# Patient Record
Sex: Male | Born: 1985 | Race: Black or African American | Hispanic: No | Marital: Single | Smoking: Never smoker
Health system: Southern US, Community
[De-identification: ages and names within clinical notes are randomized; demographics above are authoritative.]

## PROBLEM LIST (undated history)

## (undated) DIAGNOSIS — W3400XA Accidental discharge from unspecified firearms or gun, initial encounter: Secondary | ICD-10-CM

## (undated) DIAGNOSIS — I2699 Other pulmonary embolism without acute cor pulmonale: Secondary | ICD-10-CM

## (undated) HISTORY — DX: Other pulmonary embolism without acute cor pulmonale: I26.99

---
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
## 2012-08-20 DIAGNOSIS — I2699 Other pulmonary embolism without acute cor pulmonale: Secondary | ICD-10-CM

## 2012-08-20 HISTORY — DX: Other pulmonary embolism without acute cor pulmonale: I26.99

## 2012-09-08 ENCOUNTER — Encounter (HOSPITAL_COMMUNITY): Payer: Self-pay | Admitting: *Deleted

## 2012-09-08 ENCOUNTER — Emergency Department (HOSPITAL_COMMUNITY): Payer: Self-pay

## 2012-09-08 ENCOUNTER — Inpatient Hospital Stay (HOSPITAL_COMMUNITY)
Admission: EM | Admit: 2012-09-08 | Discharge: 2012-09-10 | DRG: 176 | Disposition: A | Discharge status: Home / Self Care | Payer: MEDICAID | Attending: Internal Medicine | Admitting: Internal Medicine

## 2012-09-08 DIAGNOSIS — I4891 Unspecified atrial fibrillation: Secondary | ICD-10-CM | POA: Diagnosis present

## 2012-09-08 DIAGNOSIS — E663 Overweight: Secondary | ICD-10-CM

## 2012-09-08 DIAGNOSIS — Z6835 Body mass index (BMI) 35.0-35.9, adult: Secondary | ICD-10-CM

## 2012-09-08 DIAGNOSIS — I2699 Other pulmonary embolism without acute cor pulmonale: Principal | ICD-10-CM

## 2012-09-08 DIAGNOSIS — I82401 Acute embolism and thrombosis of unspecified deep veins of right lower extremity: Secondary | ICD-10-CM

## 2012-09-08 DIAGNOSIS — E669 Obesity, unspecified: Secondary | ICD-10-CM

## 2012-09-08 DIAGNOSIS — D696 Thrombocytopenia, unspecified: Secondary | ICD-10-CM

## 2012-09-08 DIAGNOSIS — Z87891 Personal history of nicotine dependence: Secondary | ICD-10-CM

## 2012-09-08 DIAGNOSIS — I82409 Acute embolism and thrombosis of unspecified deep veins of unspecified lower extremity: Secondary | ICD-10-CM | POA: Diagnosis present

## 2012-09-08 LAB — CBC WITH DIFFERENTIAL/PLATELET
Basophils Relative: 0 % (ref 0–1)
Eosinophils Relative: 2 % (ref 0–5)
HCT: 46.4 % (ref 39.0–52.0)
Hemoglobin: 16.5 g/dL (ref 13.0–17.0)
Lymphs Abs: 2.7 10*3/uL (ref 0.7–4.0)
MCH: 30.6 pg (ref 26.0–34.0)
MCV: 86.1 fL (ref 78.0–100.0)
Monocytes Absolute: 0.6 10*3/uL (ref 0.1–1.0)
Neutro Abs: 3.7 10*3/uL (ref 1.7–7.7)
Neutrophils Relative %: 52 % (ref 43–77)
RBC: 5.39 MIL/uL (ref 4.22–5.81)

## 2012-09-08 LAB — D-DIMER, QUANTITATIVE: D-Dimer, Quant: 3.05 ug/mL-FEU — ABNORMAL HIGH (ref 0.00–0.48)

## 2012-09-08 LAB — COMPREHENSIVE METABOLIC PANEL
ALT: 19 U/L (ref 0–53)
AST: 18 U/L (ref 0–37)
CO2: 22 mEq/L (ref 19–32)
Chloride: 102 mEq/L (ref 96–112)
Creatinine, Ser: 0.96 mg/dL (ref 0.50–1.35)
GFR calc non Af Amer: >90 mL/min (ref >90)
Total Bilirubin: 0.4 mg/dL (ref 0.3–1.2)

## 2012-09-08 MED ORDER — SORBITOL 70 % SOLN: 30.0000 mL | Freq: Every day | Status: DC | PRN

## 2012-09-08 MED ORDER — ASPIRIN 325 MG PO TABS
325.0000 mg | ORAL_TABLET | Freq: Once | ORAL | Status: AC
  Administered 2012-09-08: 325 mg via ORAL
  Filled 2012-09-08: qty 1

## 2012-09-08 MED ORDER — ENOXAPARIN SODIUM 120 MG/0.8ML ~~LOC~~ SOLN
1.0000 mg/kg | Freq: Once | SUBCUTANEOUS | Status: AC
  Administered 2012-09-08: 115 mg via SUBCUTANEOUS

## 2012-09-08 MED ORDER — ENOXAPARIN SODIUM 120 MG/0.8ML ~~LOC~~ SOLN
1.0000 mg/kg | Freq: Two times a day (BID) | SUBCUTANEOUS | Status: DC
  Administered 2012-09-09 – 2012-09-10 (×3): 115 mg via SUBCUTANEOUS
  Filled 2012-09-08 (×5): qty 0.8

## 2012-09-08 MED ORDER — POLYETHYLENE GLYCOL 3350 17 G PO PACK: 17.0000 g | PACK | Freq: Every day | ORAL | Status: DC | PRN

## 2012-09-08 MED ORDER — SODIUM CHLORIDE 0.9 % IJ SOLN
3.0000 mL | Freq: Two times a day (BID) | INTRAMUSCULAR | Status: DC
  Administered 2012-09-08 – 2012-09-10 (×3): 3 mL via INTRAVENOUS

## 2012-09-08 MED ORDER — ACETAMINOPHEN 325 MG PO TABS: 650.0000 mg | ORAL_TABLET | ORAL | Status: DC | PRN

## 2012-09-08 MED ORDER — ENOXAPARIN SODIUM 120 MG/0.8ML ~~LOC~~ SOLN
SUBCUTANEOUS | Status: AC
  Filled 2012-09-08: qty 0.8

## 2012-09-08 MED ORDER — POTASSIUM CHLORIDE IN NACL 20-0.9 MEQ/L-% IV SOLN
INTRAVENOUS | Status: DC
  Administered 2012-09-08 – 2012-09-09 (×2): via INTRAVENOUS

## 2012-09-08 MED ORDER — ONDANSETRON HCL 4 MG/2ML IJ SOLN: 4.0000 mg | INTRAMUSCULAR | Status: DC | PRN

## 2012-09-08 MED ORDER — TRAZODONE HCL 50 MG PO TABS: 50.0000 mg | ORAL_TABLET | Freq: Every evening | ORAL | Status: DC | PRN

## 2012-09-08 MED ORDER — IOHEXOL 350 MG/ML SOLN
120.0000 mL | Freq: Once | INTRAVENOUS | Status: AC | PRN
  Administered 2012-09-08: 120 mL via INTRAVENOUS

## 2012-09-08 NOTE — ED Notes (Addendum)
  PATIENTS COUSIN WANTS MD TO KNOW - SHE WAS ADMITTED TO [facility] LAST YEAR DUE TO AVM OF HER LUNG - BLED OUT INTO HER LUNG. THOUGHT THIS INFORMATION SHOULD BE SHARED.

## 2012-09-08 NOTE — ED Notes (Signed)
Report called to Helmut Muster, RN  - getting a bed for patients room.  Wait 15 minutes prior to transfer

## 2012-09-08 NOTE — ED Notes (Signed)
Chest pain for over 1 month, getting worse, sob.

## 2012-09-08 NOTE — ED Notes (Signed)
Patient states he stopped smoking about a month ago, just after this trouble with his breathing began.  States he noticed when he would climb stairs he became very winded.  His grandmother sent him here today because she thought he was "breathing" hard even while sitting.  He told her this had been going on for several weeks.  She encouraged him to get this checked out-- otherwise he would not have come to the ED today

## 2012-09-08 NOTE — ED Provider Notes (Signed)
History  This chart was scribed for Benny Lennert, MD by Bennett Scrape, ED Scribe. This patient was seen in room APA06/APA06 and the patient's care was started at 6:22 PM.  CSN: [id number]  Arrival date & time 09/08/12  1807   First MD Initiated Contact with Patient 09/08/12 1822      Chief Complaint  Patient presents with  . Chest Pain     Patient is a 27 y.o. male presenting with chest pain. The history is provided by the patient. No language interpreter was used.  Chest Pain Pain location:  Substernal area Pain quality: sharp   Pain radiates to:  Does not radiate Onset quality:  Gradual Duration:  1 month Timing:  Intermittent Progression:  Worsening Chronicity:  New Relieved by:  Nothing Worsened by:  Exertion Ineffective treatments:  None tried Associated symptoms: cough and shortness of breath   Associated symptoms: no abdominal pain, no back pain, no fatigue and no headache   Risk factors: no coronary artery disease, no diabetes mellitus and no hypertension     HPI Comments: Cristian Flores is a 27 y.o. male who presents to the Emergency Department complaining of one month of intermittent substernal CP episodes described as sharp that lasts 1 to 2 minutes with associated intermittent SOB. He reports that the SOB and CP occur while working and the SOB occurs with coughing as well. He denies having any symptoms currently. Pt denies having prior episodes of similar symptoms. He denies having a h/o heart or lung problems, HTN, HLD and DM. He denies having any prior stress test or cardiac catheterizations. Pt is a former smoker and current alcohol user. He denies cocaine use.  Pt works in [employer].  History reviewed. No pertinent past medical history.  History reviewed. No pertinent past surgical history.  History reviewed. No pertinent family history.  History  Substance Use Topics  . Smoking status: Former Smoker    Quit date: 08/09/2012  . Smokeless tobacco:  Not on file  . Alcohol Use: Yes      Review of Systems  Constitutional: Negative for appetite change and fatigue.  HENT: Negative for congestion, sinus pressure and ear discharge.   Eyes: Negative for discharge.  Respiratory: Positive for cough and shortness of breath.   Cardiovascular: Positive for chest pain.  Gastrointestinal: Negative for abdominal pain and diarrhea.  Genitourinary: Negative for frequency and hematuria.  Musculoskeletal: Negative for back pain.  Skin: Negative for rash.  Neurological: Negative for seizures and headaches.  Psychiatric/Behavioral: Negative for hallucinations.    Allergies  Review of patient's allergies indicates no known allergies.  Home Medications  No current outpatient prescriptions on file.  Triage Vitals: BP 122/77  Pulse 94  Temp(Src) 97.5 F (36.4 C) (Oral)  Ht 5\' 11"  (1.803 m)  Wt 255 lb (115.667 kg)  BMI 35.58 kg/m2  SpO2 97%  Physical Exam  Nursing note and vitals reviewed. Constitutional: He is oriented to person, place, and time. He appears well-developed and well-nourished.  HENT:  Head: Normocephalic and atraumatic.  Eyes: Conjunctivae and EOM are normal. No scleral icterus.  Neck: Neck supple. No thyromegaly present.  Cardiovascular: Normal rate and regular rhythm.  Exam reveals no gallop and no friction rub.   No murmur heard. Pulmonary/Chest: Effort normal and breath sounds normal. No stridor. He has no wheezes. He has no rales. He exhibits no tenderness.  Abdominal: Soft. He exhibits no distension. There is no tenderness. There is no rebound.  Musculoskeletal: Normal range  of motion. He exhibits no edema.  Lymphadenopathy:    He has no cervical adenopathy.  Neurological: He is alert and oriented to person, place, and time. Coordination normal.  Skin: Skin is warm and dry. No rash noted. No erythema.  Psychiatric: He has a normal mood and affect. His behavior is normal.    ED Course  Procedures (including  critical care time)   Medications  aspirin tablet 325 mg (325 mg Oral Given 09/08/12 1837)    DIAGNOSTIC STUDIES: Oxygen Saturation is 96% on room air, normal by my interpretation.    COORDINATION OF CARE: 6:24 PM-Discussed treatment plan which includes ASA, CXR, CBC panel, CMP and troponin with pt at bedside and pt agreed to plan. Will admit pt based on EKG changes.  8:46 PM-Pt rechecked and feels improved with medications listed above. Pt denies any recent leg swelling or long distance travels. Informed pt of lab and radiology findings. Discussed admission with pt an pt agreed.  Labs Reviewed  CBC WITH DIFFERENTIAL - Abnormal; Notable for the following:    Platelets 118 (*)    All other components within normal limits  COMPREHENSIVE METABOLIC PANEL - Abnormal; Notable for the following:    Glucose, Bld 102 (*)    All other components within normal limits  D-DIMER, QUANTITATIVE - Abnormal; Notable for the following:    D-Dimer, Quant 3.05 (*)    All other components within normal limits  TROPONIN I   Dg Chest Port 1 View  09/08/2012   *RADIOLOGY REPORT*  Clinical Data: Chest pain  PORTABLE CHEST - 1 VIEW  Comparison: None.  Findings: Normal mediastinum and cardiac silhouette.  Normal pulmonary  vasculature.  No evidence of effusion, infiltrate, or pneumothorax.  No acute bony abnormality.  IMPRESSION: No acute cardiopulmonary process.   Original Report Authenticated By: Genevive Bi, M.D.     No diagnosis found.  Date: 09/08/2012  Rate: 95  Rhythm: normal sinus rhythm  QRS Axis: normal  Intervals: normal  ST/T Wave abnormalities: inverted t waves  Conduction Disutrbances:none  Narrative Interpretation:   Old EKG Reviewed: none available     MDM     The chart was scribed for me under my direct supervision.  I personally performed the history, physical, and medical decision making and all procedures in the evaluation of this patient.Benny Lennert,  MD 09/08/12 2101

## 2012-09-08 NOTE — H&P (Signed)
[facility] History and Physical  Cristian Flores  XBJ:[id number]  DOB: Jan 08, 1986   DOA: 09/08/2012   PCP:   No PCP Per Patient   Chief Complaint:  Chest pain and shortness of breath for one month  HPI: Cristian Flores is an 27 y.o. male.   Moderately obese young [place] American gentleman with no significant past medical presents to the emergency room initially complaining of shortness of breath and central chest pain for one  month. In fact because of shortness of breath and difficulty climbing stairs he stopped smoking about a month ago. He is also been having a nonproductive cough, and generalized fatigue and weakness.  He denies any lower extremity edema or pain, he denies any long-distance travel; there is no family history of thrombophilia.  A CT angiogram was done in the emergency room which revealed bilateral pulmonary emboli with a moderate to large clot burden and evidence of right ventricular strain.  Rewiew of Systems:   All systems negative except as marked bold or noted in the HPI;  Constitutional: malaise, fever and chills. ;  Eyes: eye pain, redness and discharge. ;  ENMT: ear pain, hoarseness, nasal congestion, sinus pressure and sore throat. ;  Cardiovascular: chest pain, palpitations, diaphoresis, dyspnea and peripheral edema. ;  Respiratory: cough, hemoptysis, wheezing and stridor. ;  Gastrointestinal: nausea, vomiting, diarrhea, constipation, abdominal pain, melena, blood in stool, hematemesis, jaundice and rectal bleeding. unusual weight loss..  Genitourinary: frequency, dysuria, incontinence,flank pain and hematuria;  Musculoskeletal: back pain and neck pain. swelling and trauma.;  Skin: . pruritus, rash, abrasions, bruising and skin lesion.; ulcerations  Neuro: headache, lightheadedness and neck stiffness. weakness, altered level of consciousness , altered mental status, extremity weakness, burning feet, involuntary movement, seizure and syncope.  Psych: anxiety,  depression, insomnia, tearfulness, panic attacks, hallucinations, paranoia, suicidal or homicidal ideation    History reviewed. No pertinent past medical history.  History reviewed. No pertinent past surgical history.  Medications:  HOME MEDS: Prior to Admission medications   Not on File  No home meds   Allergies:  No Known Allergies  Social History:   reports that he quit smoking about 4 weeks ago. He does not have any smokeless tobacco history on file. He reports that  drinks alcohol. He reports that he does not use illicit drugs.  Family History:   His father is both diabetic and hypertensive; there is no other relevant family history   Physical Exam: Filed Vitals:   09/08/12 1816 09/08/12 1930  BP: 122/77 104/67  Pulse: 94 93  Temp: 97.5 F (36.4 C)   TempSrc: Oral   Resp:  25  Height: 5\' 11"  (1.803 m)   Weight: 115.667 kg (255 lb)   SpO2: 97% 97%   Blood pressure 104/67, pulse 93, temperature 97.5 F (36.4 C), temperature source Oral, resp. rate 25, height 5\' 11"  (1.803 m), weight 115.667 kg (255 lb), SpO2 97.00%.  GEN:  Pleasant obese young African American gentleman lying bed in no acute distress; cooperative with exam PSYCH:  alert and oriented x4; somewhat anxious; affect is appropriate. HEENT: Mucous membranes pink and anicteric; PERRLA; EOM intact; no cervical lymphadenopathy nor thyromegaly or carotid bruit; no JVD; Breasts:: Not examined CHEST WALL: No tenderness CHEST: Tachypnea, clear to auscultation bilaterally HEART: Regular rate; gallop rhythm; no murmur or rub BACK: No kyphosis no scoliosis; no CVA tenderness ABDOMEN: Obese, soft non-tender; no masses, no organomegaly, normal abdominal bowel sounds;  Rectal Exam: Not done EXTREMITIES: No bone or  joint deformity; no swelling; no edema; no tenderness; no ulcerations. Genitalia: not examined PULSES: 2+ and symmetric SKIN: Normal hydration no rash or ulceration CNS: Cranial nerves 2-12 grossly  intact no focal lateralizing neurologic deficit   Labs on Admission:  Basic Metabolic Panel:  Recent Labs Lab 09/08/12 1848  NA 138  K 3.8  CL 102  CO2 22  GLUCOSE 102*  BUN 14  CREATININE 0.96  CALCIUM 9.4   Liver Function Tests:  Recent Labs Lab 09/08/12 1848  AST 18  ALT 19  ALKPHOS 69  BILITOT 0.4  PROT 7.6  ALBUMIN 4.0   No results found for this basename: LIPASE, AMYLASE,  in the last 168 hours No results found for this basename: AMMONIA,  in the last 168 hours CBC:  Recent Labs Lab 09/08/12 1848  WBC 7.1  NEUTROABS 3.7  HGB 16.5  HCT 46.4  MCV 86.1  PLT 118*   Cardiac Enzymes:  Recent Labs Lab 09/08/12 1848  TROPONINI <0.30   BNP: No components found with this basename: POCBNP,  D-dimer: No components found with this basename: D-DIMER,  CBG: No results found for this basename: GLUCAP,  in the last 168 hours  Radiological Exams on Admission: Ct Angio Chest Pe W/cm &/or Wo Cm  09/08/2012   *RADIOLOGY REPORT*  Clinical Data: 41-month history of substernal chest pain, worse with exertion, cough, shortness of breath.  Former smoker.  CT ANGIOGRAPHY CHEST  Technique:  Multidetector CT imaging of the chest using the standard protocol during bolus administration of intravenous contrast. Multiplanar reconstructed images including MIPs were obtained and reviewed to evaluate the vascular anatomy.  Because of scanner malfunction during the initial imaging sequence, the images were repeated.  A diagnostic study was obtained.  Contrast:  120 ml Omnipaque 350 IV total.  Comparison: None.  Findings: Initial contrast opacification was very good.  The repeated images after the scanner malfunctioned have moderate contrast opacification of the pulmonary arteries.  However, the study is diagnostic.  Filling defects within the distal right and left main pulmonary arteries extending into branches of the left upper lobe pulmonary artery, branches of the left lower lobe  pulmonary artery, is of the right upper lobe pulmonary artery, the interlobar pulmonary artery, and branches of the right middle and lower lobe pulmonary arteries. Clot burden is moderate to large.  There is evidence of right heart strain, as the right ventricular diameter is greater than the left ventricular diameter.  Heart size upper normal to slightly enlarged.  No pericardial effusion.  No visible coronary atherosclerosis.  No visible atherosclerosis involving the thoracic or upper abdominal aorta.  Mild atelectasis in the lower lobes.  Lungs otherwise clear without localized airspace consolidation, interstitial disease, parenchymal nodules or masses, or evidence of pulmonary infarction.  No pleural effusions.  Central airways patent without significant bronchial wall thickening.  No significant mediastinal, hilar, or axillary lymphadenopathy. Thyroid gland mildly enlarged without nodularity.  Visualized extreme upper abdomen unremarkable.  Bone window images unremarkable.  IMPRESSION:  1.  Bilateral pulmonary emboli with moderate to large clot burden. Evidence of right heart strain. 2.  Mild atelectasis in the lower lobes.  No acute cardiopulmonary disease otherwise. 3.  Mild thyroid gland enlargement without nodularity.  These results were called by telephone on 09/08/2012 at 2043 hours to Dr. Estell Harpin of the emergency department, who verbally acknowledged these results.   Original Report Authenticated By: Hulan Saas, M.D.   Dg Chest Port 1 View  09/08/2012   *RADIOLOGY  REPORT*  Clinical Data: Chest pain  PORTABLE CHEST - 1 VIEW  Comparison: None.  Findings: Normal mediastinum and cardiac silhouette.  Normal pulmonary  vasculature.  No evidence of effusion, infiltrate, or pneumothorax.  No acute bony abnormality.  IMPRESSION: No acute cardiopulmonary process.   Original Report Authenticated By: Genevive Bi, M.D.    EKG: Independently reviewed. Poor R wave progression anterior leads; T waves V1 to  V4;   Assessment/Plan  Active Problems:      PE (pulmonary embolism)   Obesity, unspecified   Thrombocytopenia, unspecified   PLAN: Although the physical exam on the CT are suggestive of right ventricular strain, he has remarkable cardiovascular stability, and is saturating at 95% on room air. Will keep him on telemetry and start anti-coagulation with Lovenox. We'll defer initiation of oral to his primary care team will may choose warfarin or one of the newer anticoagulants. Of note this gentleman has no insurance.  Counseled him on the importance of weight loss and congratulated him on nicotine cessation.  His thrombocytopenia is somewhat concerning since we are starting Lovenox; it may be temporary due to high clot burden, but may need to switch to a non-heparin anticoagulant if platelet count falls further.  Other plans as per orders.  Code Status: Full code  Family Communication: Both appearance and other family present at the interview and examination  Disposition Plan: Likely discharged home in less than a week    Sampson Self Nocturnist [facility] Pager [phone]   09/08/2012, 8:59 PM

## 2012-09-09 ENCOUNTER — Inpatient Hospital Stay (HOSPITAL_COMMUNITY): Payer: MEDICAID

## 2012-09-09 DIAGNOSIS — E669 Obesity, unspecified: Secondary | ICD-10-CM | POA: Diagnosis present

## 2012-09-09 DIAGNOSIS — D696 Thrombocytopenia, unspecified: Secondary | ICD-10-CM | POA: Diagnosis present

## 2012-09-09 DIAGNOSIS — I369 Nonrheumatic tricuspid valve disorder, unspecified: Secondary | ICD-10-CM

## 2012-09-09 LAB — COMPREHENSIVE METABOLIC PANEL
BUN: 13 mg/dL (ref 6–23)
CO2: 22 mEq/L (ref 19–32)
Calcium: 8.9 mg/dL (ref 8.4–10.5)
Chloride: 106 mEq/L (ref 96–112)
Creatinine, Ser: 0.95 mg/dL (ref 0.50–1.35)
GFR calc Af Amer: >90 mL/min (ref >90)
GFR calc non Af Amer: >90 mL/min (ref >90)
Glucose, Bld: 115 mg/dL — ABNORMAL HIGH (ref 70–99)
Total Bilirubin: 0.3 mg/dL (ref 0.3–1.2)

## 2012-09-09 LAB — URINALYSIS, ROUTINE W REFLEX MICROSCOPIC
Glucose, UA: NEGATIVE mg/dL
Hgb urine dipstick: NEGATIVE
Ketones, ur: NEGATIVE mg/dL
Leukocytes, UA: NEGATIVE
pH: 6 (ref 5.0–8.0)

## 2012-09-09 LAB — CBC
HCT: 45.7 % (ref 39.0–52.0)
Hemoglobin: 16 g/dL (ref 13.0–17.0)
WBC: 6.2 10*3/uL (ref 4.0–10.5)

## 2012-09-09 LAB — ANTITHROMBIN III: AntiThromb III Func: 95 % (ref 75–120)

## 2012-09-09 NOTE — Progress Notes (Signed)
[facility] PROGRESS NOTE  Cristian Flores ZOX:[id number] DOB: 08/20/1985 DOA: 09/08/2012 PCP: No PCP Per Patient  Assessment/Plan: 1. Bilateral pulmonary emboli with moderate to large clot burden and associated RV dysfunction. Patient has been started on Lovenox for anticoagulation. He does not describe any inciting factors leading to his thromboembolism. Hypercoagulable panel has been sent and is pending. We'll check venous Dopplers of the lower extremities. Since the patient's lack of insurance, we are determining the best plan for outpatient therapy. Case management is arranging medications through assistance programs. I have discussed the case with hematology. Patient will followup in the hematology clinic for further management of his pulmonary emboli. Clinically, the patient appears to be doing quite well. Anticipate that he will be able to discharge home soon once appropriate outpatient therapy has been arranged. 2. Thrombocytopenia. Likely secondary to consumption from acute thrombosis.   Code Status: full code Family Communication: discussed with patient Disposition Plan: discharge home once stable   Consultants:  none  Procedures: Echo: - Left ventricle: The cavity size was normal. There was mild focal basal hypertrophy of the septum. Systolic function was normal. The estimated ejection fraction was in the range of 55% to 60%. Wall motion was normal; there were no regional wall motion abnormalities. Doppler parameters are consistent with abnormal left ventricular relaxation (grade 1 diastolic dysfunction). - Ventricular septum: The contour showed diastolic flattening and systolic flattening consistent with RV volume and pressure overload. - Right ventricle: The cavity size was moderately dilated. The moderator band was prominent. Systolic function was severely reduced. There is akinesisof the mid to distal RV free andlateral walls. McConnell's sign present (not entierely  classic since RV apical function is also somewhat impared) consistent with diagnosis of hemodynamically significant pulmonary embolus. - Right atrium: The atrium was moderately dilated. Central venous pressure: 15mm Hg (est). - Tricuspid valve: Mild regurgitation. - Pulmonary arteries: PA peak pressure: 67mm Hg (S). - Inferior vena cava: The vessel was dilated; the respirophasic diameter changes were blunted (< 50%); findings are consistent with elevated central venous pressure. - Pericardium, extracardiac: A trivial pericardial effusion was identified.    Antibiotics:  none  HPI/Subjective: No new complaints.  No chest pain.  Only mild shortness of breath on exertion.  Objective: Filed Vitals:   09/08/12 2232 09/09/12 0151 09/09/12 0649 09/09/12 1051  BP: 126/81 119/86 124/80 136/86  Pulse: 100 98 86 97  Temp: 98.1 F (36.7 C) 98.1 F (36.7 C) 99.5 F (37.5 C) 97.4 F (36.3 C)  TempSrc: Oral Oral Oral Oral  Resp: 20 20 20 20   Height: 5\' 11"  (1.803 m)     Weight: 114.9 kg (253 lb 4.9 oz)  116.892 kg (257 lb 11.2 oz)   SpO2: 95% 95% 95% 97%    Intake/Output Summary (Last 24 hours) at 09/09/12 1443 Last data filed at 09/09/12 1318  Gross per 24 hour  Intake    240 ml  Output    600 ml  Net   -360 ml   Filed Weights   09/08/12 1816 09/08/12 2232 09/09/12 0649  Weight: 115.667 kg (255 lb) 114.9 kg (253 lb 4.9 oz) 116.892 kg (257 lb 11.2 oz)    Exam:   General:  NAD  Cardiovascular: S1, S2 RRR  Respiratory: CTA B  Abdomen: Soft, nt, nd, bs+  Musculoskeletal: no pedal edema   Data Reviewed: Basic Metabolic Panel:  Recent Labs Lab 09/08/12 1848 09/09/12 0549  NA 138 140  K 3.8 4.0  CL 102 106  CO2 22 22  GLUCOSE 102* 115*  BUN 14 13  CREATININE 0.96 0.95  CALCIUM 9.4 8.9   Liver Function Tests:  Recent Labs Lab 09/08/12 1848 09/09/12 0549  AST 18 15  ALT 19 18  ALKPHOS 69 62  BILITOT 0.4 0.3  PROT 7.6 6.8  ALBUMIN 4.0 3.5   No  results found for this basename: LIPASE, AMYLASE,  in the last 168 hours No results found for this basename: AMMONIA,  in the last 168 hours CBC:  Recent Labs Lab 09/08/12 1848 09/09/12 0549  WBC 7.1 6.2  NEUTROABS 3.7  --   HGB 16.5 16.0  HCT 46.4 45.7  MCV 86.1 87.0  PLT 118* 133*   Cardiac Enzymes:  Recent Labs Lab 09/08/12 1848  TROPONINI <0.30   BNP (last 3 results) No results found for this basename: PROBNP,  in the last 8760 hours CBG: No results found for this basename: GLUCAP,  in the last 168 hours  No results found for this or any previous visit (from the past 240 hour(s)).   Studies: Ct Angio Chest Pe W/cm &/or Wo Cm  09/08/2012   *RADIOLOGY REPORT*  Clinical Data: 72-month history of substernal chest pain, worse with exertion, cough, shortness of breath.  Former smoker.  CT ANGIOGRAPHY CHEST  Technique:  Multidetector CT imaging of the chest using the standard protocol during bolus administration of intravenous contrast. Multiplanar reconstructed images including MIPs were obtained and reviewed to evaluate the vascular anatomy.  Because of scanner malfunction during the initial imaging sequence, the images were repeated.  A diagnostic study was obtained.  Contrast:  120 ml Omnipaque 350 IV total.  Comparison: None.  Findings: Initial contrast opacification was very good.  The repeated images after the scanner malfunctioned have moderate contrast opacification of the pulmonary arteries.  However, the study is diagnostic.  Filling defects within the distal right and left main pulmonary arteries extending into branches of the left upper lobe pulmonary artery, branches of the left lower lobe pulmonary artery, is of the right upper lobe pulmonary artery, the interlobar pulmonary artery, and branches of the right middle and lower lobe pulmonary arteries. Clot burden is moderate to large.  There is evidence of right heart strain, as the right ventricular diameter is greater than  the left ventricular diameter.  Heart size upper normal to slightly enlarged.  No pericardial effusion.  No visible coronary atherosclerosis.  No visible atherosclerosis involving the thoracic or upper abdominal aorta.  Mild atelectasis in the lower lobes.  Lungs otherwise clear without localized airspace consolidation, interstitial disease, parenchymal nodules or masses, or evidence of pulmonary infarction.  No pleural effusions.  Central airways patent without significant bronchial wall thickening.  No significant mediastinal, hilar, or axillary lymphadenopathy. Thyroid gland mildly enlarged without nodularity.  Visualized extreme upper abdomen unremarkable.  Bone window images unremarkable.  IMPRESSION:  1.  Bilateral pulmonary emboli with moderate to large clot burden. Evidence of right heart strain. 2.  Mild atelectasis in the lower lobes.  No acute cardiopulmonary disease otherwise. 3.  Mild thyroid gland enlargement without nodularity.  These results were called by telephone on 09/08/2012 at 2043 hours to Dr. Estell Harpin of the emergency department, who verbally acknowledged these results.   Original Report Authenticated By: Hulan Saas, M.D.   Dg Chest Port 1 View  09/08/2012   *RADIOLOGY REPORT*  Clinical Data: Chest pain  PORTABLE CHEST - 1 VIEW  Comparison: None.  Findings: Normal mediastinum and cardiac silhouette.  Normal pulmonary  vasculature.  No evidence of effusion, infiltrate, or pneumothorax.  No acute bony abnormality.  IMPRESSION: No acute cardiopulmonary process.   Original Report Authenticated By: Genevive Bi, M.D.    Scheduled Meds: . enoxaparin (LOVENOX) injection  1 mg/kg Subcutaneous Q12H  . sodium chloride  3 mL Intravenous Q12H   Continuous Infusions: . 0.9 % NaCl with KCl 20 mEq / L 100 mL/hr at 09/09/12 1610    Active Problems:   Overweight   PE (pulmonary embolism)   Obesity, unspecified   Thrombocytopenia, unspecified    Time spent:     MEMON,JEHANZEB  [facility] Pager [phone]. If 7PM-7AM, please contact night-coverage at [email or web address], password [facility] 09/09/2012, 2:43 PM  LOS: 1 day

## 2012-09-09 NOTE — Care Management Note (Signed)
    Page 1 of 1   09/10/2012     3:35:22 PM   CARE MANAGEMENT NOTE 09/10/2012  Patient:  Cristian Flores, Cristian Flores   Account Number:  [id number]  Date Initiated:  09/09/2012  Documentation initiated by:  Sharrie Rothman  Subjective/Objective Assessment:   Pt admitted from home with bilateral PE's. Pt lives with his brother and will return home at discharge. Pt does work but has been laid off for now. Pt has no insurance and no PCP.     Action/Plan:   CM will fax application form to [employer] for assistance with Xarelto. If pt does not qualify, will give voucher for medication assistance. CM will also arrange PCP prior to discharge. Financial counselor is aware of self pay status.   Anticipated DC Date:  09/14/2012   Anticipated DC Plan:  HOME/SELF CARE  In-house referral  Financial Counselor      DC Planning Services  CM consult      Choice offered to / List presented to:             Status of service:  Completed, signed off Medicare Important Message given?   (If response is "NO", the following Medicare IM given date fields will be blank) Date Medicare IM given:   Date Additional Medicare IM given:    Discharge Disposition:  HOME/SELF CARE  Per UR Regulation:    If discussed at Long Length of Stay Meetings, dates discussed:    Comments:  09/10/12 1530 Arlyss Queen, RN BSN CM Pt discharging home today. Laural Benes and [employer] Patient Assistance Program has approved pt for help with Xarelto. Pt has all numbers and information to get prescriptions filled at pharmacy. Pt has also been scheduled for PCP followup and Hematology followup and documented on AVS. No other needs noted.  09/09/12 1200 Arlyss Queen, RN BSN CM

## 2012-09-09 NOTE — Progress Notes (Signed)
*  PRELIMINARY RESULTS* Echocardiogram 2D Echo without contrast has been performed.  Conrad Buchanan [place] 09/09/2012, 9:55 AM

## 2012-09-09 NOTE — Plan of Care (Signed)
Problem: Phase I Progression Outcomes Goal: Pain controlled with appropriate interventions Outcome: Completed/Met Date Met:  09/09/12 Denies pain Goal: OOB as tolerated unless otherwise ordered Outcome: Completed/Met Date Met:  09/09/12 Up ad lib

## 2012-09-09 NOTE — Progress Notes (Signed)
UR chart review completed.  

## 2012-09-09 NOTE — Clinical Social Work Note (Signed)
CSW received referral for no insurance and need for chronic anticoagulation. CM notified. CSW signing off but can be reconsulted if needed.  Derenda Fennel, [place] [phone]

## 2012-09-10 DIAGNOSIS — I82409 Acute embolism and thrombosis of unspecified deep veins of unspecified lower extremity: Secondary | ICD-10-CM

## 2012-09-10 DIAGNOSIS — I82401 Acute embolism and thrombosis of unspecified deep veins of right lower extremity: Secondary | ICD-10-CM | POA: Diagnosis present

## 2012-09-10 DIAGNOSIS — E663 Overweight: Secondary | ICD-10-CM

## 2012-09-10 LAB — LUPUS ANTICOAGULANT PANEL: Lupus Anticoagulant: NOT DETECTED

## 2012-09-10 LAB — CBC
Hemoglobin: 15.6 g/dL (ref 13.0–17.0)
Platelets: 137 10*3/uL — ABNORMAL LOW (ref 150–400)
RBC: 5.12 MIL/uL (ref 4.22–5.81)

## 2012-09-10 MED ORDER — RIVAROXABAN 15 MG PO TABS: 15.0000 mg | ORAL_TABLET | Freq: Two times a day (BID) | ORAL | Status: DC

## 2012-09-10 MED ORDER — RIVAROXABAN 20 MG PO TABS: 20.0000 mg | ORAL_TABLET | Freq: Every day | ORAL | Status: DC

## 2012-09-10 NOTE — Discharge Summary (Signed)
Physician Discharge Summary  Cristian Flores:[id number] DOB: March 08, 1986 DOA: 09/08/2012  PCP: No PCP Per Patient  Admit date: 09/08/2012 Discharge date: 09/10/2012  Time spent: 40 minutes  Recommendations for Outpatient Follow-up:  1. Patient was set up with primary care physician. 2. He'll followup with the hematology clinic for further management of his pulmonary embolism and followup of his hypercoagulable panel.  Discharge Diagnoses:  Active Problems:   Overweight   PE (pulmonary embolism)   Obesity, unspecified   Thrombocytopenia, unspecified   Right leg DVT   Discharge Condition: Improved  Diet recommendation: Regular diet  Filed Weights   09/08/12 2232 09/09/12 0649 09/10/12 0500  Weight: 114.9 kg (253 lb 4.9 oz) 116.892 kg (257 lb 11.2 oz) 116.9 kg (257 lb 11.5 oz)    History of present illness:  Cristian Flores is an 27 y.o. male. Moderately obese young [place] American gentleman with no significant past medical presents to the emergency room initially complaining of shortness of breath and central chest pain for one month. In fact because of shortness of breath and difficulty climbing stairs he stopped smoking about a month ago. He is also been having a nonproductive cough, and generalized fatigue and weakness.  He denies any lower extremity edema or pain, he denies any long-distance travel; there is no family history of thrombophilia.  A CT angiogram was done in the emergency room which revealed bilateral pulmonary emboli with a moderate to large clot burden and evidence of right ventricular strain.   Hospital Course:  This patient was admitted to the hospital with chest pain and shortness of breath. He was found to have bilateral pulmonary emboli with moderate to large clot burden and associated right ventricular strain. He had an episode of transient atrial fibrillation in the emergency room. He was admitted to the medical floor on telemetry and was started on therapeutic  dose of Lovenox. Echocardiogram confirmed right ventricular dysfunction. Hypercoagulable panel was sent and is pending. He has been transitioned to Xarelto therapy and will need to stay on anticoagulation for at least 6 months. I discussed the case with the hematology clinic who will follow the patient as an outpatient. The patient does not have shortness of breath chest pain. He is ambulating without any difficulty and is requesting discharge home. The patient did have right lower extremity venous Dopplers performed which did indicate remnants of a DVT. This was likely the source of the pulmonary emboli. He will need to continue anticoagulation.  Procedures: Echo:- Left ventricle: The cavity size was normal. There was mild focal basal hypertrophy of the septum. Systolic function was normal. The estimated ejection fraction was in the range of 55% to 60%. Wall motion was normal; there were no regional wall motion abnormalities. Doppler parameters are consistent with abnormal left ventricular relaxation (grade 1 diastolic dysfunction). - Ventricular septum: The contour showed diastolic flattening and systolic flattening consistent with RV volume and pressure overload. - Right ventricle: The cavity size was moderately dilated. The moderator band was prominent. Systolic function was severely reduced. There is akinesisof the mid to distal RV free andlateral walls. McConnell's sign present (not entierely classic since RV apical function is also somewhat impared) consistent with diagnosis of hemodynamically significant pulmonary embolus. - Right atrium: The atrium was moderately dilated. Central venous pressure: 15mm Hg (est). - Tricuspid valve: Mild regurgitation. - Pulmonary arteries: PA peak pressure: 67mm Hg (S). - Inferior vena cava: The vessel was dilated; the respirophasic diameter changes were blunted (< 50%); findings  are consistent with elevated central venous pressure. - Pericardium,  extracardiac: A trivial pericardial effusion was identified.    Consultations:  None  Discharge Exam: Filed Vitals:   09/09/12 1850 09/09/12 2152 09/10/12 0500 09/10/12 1424  BP: 102/66 130/85 107/73 115/80  Pulse: 92 95 88 88  Temp: 97.9 F (36.6 C) 97.9 F (36.6 C) 97.5 F (36.4 C) 97.9 F (36.6 C)  TempSrc: Oral Oral Oral Oral  Resp: 20 20 17 18   Height:      Weight:   116.9 kg (257 lb 11.5 oz)   SpO2: 93% 94% 94% 98%    General: No acute distress Cardiovascular: S1, S2, regular rate and rhythm Respiratory: Clear to auscultation bilaterally  Discharge Instructions  Discharge Orders   Future Appointments Provider Department Dept Phone   10/01/2012 3:00 PM Claudia Desanctis [facility] [phone]   Future Orders Complete By Expires     Call MD for:  difficulty breathing, headache or visual disturbances  As directed     Call MD for:  extreme fatigue  As directed     Call MD for:  redness, tenderness, or signs of infection (pain, swelling, redness, odor or green/yellow discharge around incision site)  As directed     Diet - low sodium heart healthy  As directed     Increase activity slowly  As directed         Medication List    TAKE these medications       Rivaroxaban 15 MG Tabs tablet  Commonly known as:  XARELTO  Take 1 tablet (15 mg total) by mouth 2 (two) times daily. For 21 days     Rivaroxaban 20 MG Tabs  Commonly known as:  XARELTO  Take 1 tablet (20 mg total) by mouth daily. Start taking this medication once Xarelto 15mg  tabs are complete       No Known Allergies     Follow-up Information   Follow up On 09/24/2012. (at 10:15)    Contact information:   [facility] [phone]       Follow up with [facility] On 10/01/2012. (at 3:00)    Contact information:   [address] [place] [address]        The results of significant diagnostics from this hospitalization (including  imaging, microbiology, ancillary and laboratory) are listed below for reference.    Significant Diagnostic Studies: Ct Angio Chest Pe W/cm &/or Wo Cm  09/08/2012   *RADIOLOGY REPORT*  Clinical Data: 43-month history of substernal chest pain, worse with exertion, cough, shortness of breath.  Former smoker.  CT ANGIOGRAPHY CHEST  Technique:  Multidetector CT imaging of the chest using the standard protocol during bolus administration of intravenous contrast. Multiplanar reconstructed images including MIPs were obtained and reviewed to evaluate the vascular anatomy.  Because of scanner malfunction during the initial imaging sequence, the images were repeated.  A diagnostic study was obtained.  Contrast:  120 ml Omnipaque 350 IV total.  Comparison: None.  Findings: Initial contrast opacification was very good.  The repeated images after the scanner malfunctioned have moderate contrast opacification of the pulmonary arteries.  However, the study is diagnostic.  Filling defects within the distal right and left main pulmonary arteries extending into branches of the left upper lobe pulmonary artery, branches of the left lower lobe pulmonary artery, is of the right upper lobe pulmonary artery, the interlobar pulmonary artery, and branches of the right middle and  lower lobe pulmonary arteries. Clot burden is moderate to large.  There is evidence of right heart strain, as the right ventricular diameter is greater than the left ventricular diameter.  Heart size upper normal to slightly enlarged.  No pericardial effusion.  No visible coronary atherosclerosis.  No visible atherosclerosis involving the thoracic or upper abdominal aorta.  Mild atelectasis in the lower lobes.  Lungs otherwise clear without localized airspace consolidation, interstitial disease, parenchymal nodules or masses, or evidence of pulmonary infarction.  No pleural effusions.  Central airways patent without significant bronchial wall thickening.  No  significant mediastinal, hilar, or axillary lymphadenopathy. Thyroid gland mildly enlarged without nodularity.  Visualized extreme upper abdomen unremarkable.  Bone window images unremarkable.  IMPRESSION:  1.  Bilateral pulmonary emboli with moderate to large clot burden. Evidence of right heart strain. 2.  Mild atelectasis in the lower lobes.  No acute cardiopulmonary disease otherwise. 3.  Mild thyroid gland enlargement without nodularity.  These results were called by telephone on 09/08/2012 at 2043 hours to Dr. Estell Harpin of the emergency department, who verbally acknowledged these results.   Original Report Authenticated By: Hulan Saas, M.D.   US Venous Img Lower Bilateral  09/09/2012   *RADIOLOGY REPORT*  Clinical Data: History of prior DVT and pulmonary embolism  BILATERAL LOWER EXTREMITY VENOUS DUPLEX ULTRASOUND  Technique:  Gray-scale sonography with graded compression, as well as color Doppler and duplex ultrasound, were performed to evaluate the deep venous system of both lower extremities from the level of the common femoral vein through the popliteal and proximal calf veins.  Spectral Doppler was utilized to evaluate flow at rest and with distal augmentation maneuvers.  Comparison:  Chest CT - 09/08/2012  Findings:  There is a minimal amount of nonocclusive slightly hyperechoic thrombus within the right popliteal vein (images 20 through 23).  Otherwise, normal compressibility of bilateral common femoral, superficial femoral, and left popliteal veins is demonstrated, as well as the visualized proximal calf veins.  No filling defects to suggest DVT on grayscale or color Doppler imaging.  IMPRESSION:  1.  Very minimal amount of nonocclusive thrombus within the right popliteal vein.  2. Otherwise, no evidence of DVT within either lower extremity.   Original Report Authenticated By: Tacey Ruiz, MD   Dg Chest Port 1 View  09/08/2012   *RADIOLOGY REPORT*  Clinical Data: Chest pain  PORTABLE CHEST - 1  VIEW  Comparison: None.  Findings: Normal mediastinum and cardiac silhouette.  Normal pulmonary  vasculature.  No evidence of effusion, infiltrate, or pneumothorax.  No acute bony abnormality.  IMPRESSION: No acute cardiopulmonary process.   Original Report Authenticated By: Genevive Bi, M.D.    Microbiology: No results found for this or any previous visit (from the past 240 hour(s)).   Labs: Basic Metabolic Panel:  Recent Labs Lab 09/08/12 1848 09/09/12 0549  NA 138 140  K 3.8 4.0  CL 102 106  CO2 22 22  GLUCOSE 102* 115*  BUN 14 13  CREATININE 0.96 0.95  CALCIUM 9.4 8.9   Liver Function Tests:  Recent Labs Lab 09/08/12 1848 09/09/12 0549  AST 18 15  ALT 19 18  ALKPHOS 69 62  BILITOT 0.4 0.3  PROT 7.6 6.8  ALBUMIN 4.0 3.5   No results found for this basename: LIPASE, AMYLASE,  in the last 168 hours No results found for this basename: AMMONIA,  in the last 168 hours CBC:  Recent Labs Lab 09/08/12 1848 09/09/12 0549 09/10/12 0527  WBC 7.1  6.2 6.5  NEUTROABS 3.7  --   --   HGB 16.5 16.0 15.6  HCT 46.4 45.7 44.3  MCV 86.1 87.0 86.5  PLT 118* 133* 137*   Cardiac Enzymes:  Recent Labs Lab 09/08/12 1848  TROPONINI <0.30   BNP: BNP (last 3 results) No results found for this basename: PROBNP,  in the last 8760 hours CBG: No results found for this basename: GLUCAP,  in the last 168 hours     Signed:  Oather Muilenburg  [facility] 09/10/2012, 3:37 PM

## 2012-09-11 LAB — FACTOR 5 LEIDEN

## 2012-09-11 LAB — PROTEIN S, TOTAL: Protein S Ag, Total: 128 % (ref 60–150)

## 2012-09-11 LAB — PROTEIN C, TOTAL: Protein C, Total: 85 % (ref 72–160)

## 2012-09-11 LAB — BETA-2-GLYCOPROTEIN I ABS, IGG/M/A
Beta-2 Glyco I IgG: 0 G Units (ref <20)
Beta-2-Glycoprotein I IgA: 0 A Units (ref <20)

## 2012-10-01 ENCOUNTER — Encounter (HOSPITAL_COMMUNITY): Payer: Self-pay

## 2012-10-01 ENCOUNTER — Encounter (HOSPITAL_COMMUNITY): Payer: Self-pay | Attending: Hematology and Oncology

## 2012-10-01 ENCOUNTER — Ambulatory Visit (HOSPITAL_COMMUNITY): Payer: Self-pay | Admitting: Oncology

## 2012-10-01 VITALS — BP 121/78 | HR 83 | Temp 97.2°F | Resp 18 | Wt 259.0 lb

## 2012-10-01 DIAGNOSIS — Z7901 Long term (current) use of anticoagulants: Secondary | ICD-10-CM | POA: Insufficient documentation

## 2012-10-01 DIAGNOSIS — I519 Heart disease, unspecified: Secondary | ICD-10-CM

## 2012-10-01 DIAGNOSIS — I2699 Other pulmonary embolism without acute cor pulmonale: Secondary | ICD-10-CM | POA: Insufficient documentation

## 2012-10-01 DIAGNOSIS — F17200 Nicotine dependence, unspecified, uncomplicated: Secondary | ICD-10-CM

## 2012-10-01 NOTE — Progress Notes (Signed)
Patient History and Physical   Cristian Flores [id number] 08-19-1985 27 y.o. 10/01/2012    Chief Complaint: Pulmonary embolism.   HPI:  Cristian Flores is a 27 year old  Man was hospitalized in May of 2014 4 fairly large bilateral pulmonary embolism involving the main pulmonary arteries.  This was spontaneous and patient prior to  this was reportedly very active even though he is a smoker.there was evidence of right heart strain impression was treated with anticoagulation initially with Lovenox and was transitioned to Xarelto which is still currently on.he tells me that prior to hospitalization he was having shortness of breath for about 4 weeks.  He states that he feels better with decreased shortness of breath at present. Doppler of lower extremities, showed minimal amount of nonocclusive thrombus within the right popliteal vein. Both CTA and doppler results are detailed below.  While in the hospital he had extensive hypercoagulability workup, including protein C and protein S, antithrombin III, factor V Leiden,antiphospholipid antibodies which were all negative.I was not able to find prothrombin gene mutation.  Patient denies any  prior personal history of DVT or PE or family history of hypercoagulability disorder.   PMH: Past Medical History  Diagnosis Date  . Pulmonary embolism May 2014    History reviewed. No pertinent past surgical history.  Allergies: No Known Allergies  Medications: Current outpatient prescriptions:Rivaroxaban (XARELTO) 15 MG TABS tablet, Take 1 tablet (15 mg total) by mouth 2 (two) times daily. For 21 days, Disp: 42 tablet, Rfl: 0;  Rivaroxaban (XARELTO) 20 MG TABS, Take 1 tablet (20 mg total) by mouth daily. Start taking this medication once Xarelto 15mg  tabs are complete, Disp: 30 tablet, Rfl: 6   Social History:   reports that he has been smoking Cigarettes.  He has been smoking about 0.00 packs per day. He does not have any smokeless tobacco history on file. He  reports that he drinks about 3.5 ounces of alcohol per week. He reports that he does not use illicit drugs. He states that he smokes a black  And Myer cigarettes about 5 sticks a day and reportedly has been smoking for 2 years.  He drinks about 4 drinks a day.  Family History: Family History  Problem Relation Age of Onset  . Diabetes Father   . Hypertension Father   He denies any family history of hypercoagulability disorder.has 2 boys and a girl all of whom do not have any history of hypercoagulability disorder.  He is single works in [employer] and reportedly live an active lifestyle including clean basketball prior to developing DVT/PE recently.  Review of Systems: 14 point review of system is as in the history above otherwise negative.   Physical Exam: Blood pressure 121/78, pulse 83, temperature 97.2 F (36.2 C), temperature source Oral, resp. rate 18, weight 259 lb (117.482 kg). GENERAL: No distress, obese.  SKIN:  No rashes or significant lesions an ecchymosis  HEAD: Normocephalic, No masses, lesions, tenderness or abnormalities  EYES: Conjunctiva are pink and non-injected  ENT: External ears normal ,lips, buccal mucosa, and tongue normal and mucous membranes are moist  LYMPH: No palpable lymphadenopathy, in the neck, supraclavicular or axilla. BREAST:Normal without mass, skin or nipple changes or axillary nodes,  LUNGS: clear to auscultation , no crackles or wheezes HEART: regular rate & rhythm, no murmurs, no gallops, S1 normal and S2 normal  ABDOMEN: Abdomen soft, non-tender, normal bowel sounds, no masses or organomegaly and no hepatosplenomegaly palpable. EXTREMITIES: No edema, no skin discoloration or tenderness NEURO:  alert & oriented , no focal motor/sensory deficits.     Lab Results: Lab Results  Component Value Date   WBC 6.5 09/10/2012   HGB 15.6 09/10/2012   HCT 44.3 09/10/2012   MCV 86.5 09/10/2012   PLT 137* 09/10/2012     Chemistry      Component Value  Date/Time   NA 140 09/09/2012 0549   K 4.0 09/09/2012 0549   CL 106 09/09/2012 0549   CO2 22 09/09/2012 0549   BUN 13 09/09/2012 0549   CREATININE 0.95 09/09/2012 0549      Component Value Date/Time   CALCIUM 8.9 09/09/2012 0549   ALKPHOS 62 09/09/2012 0549   AST 15 09/09/2012 0549   ALT 18 09/09/2012 0549   BILITOT 0.3 09/09/2012 0549         Radiological Studies: Ct Angio Chest Pe W/cm &/or Wo Cm  09/08/2012   *RADIOLOGY REPORT*  Clinical Data: 5-month history of substernal chest pain, worse with exertion, cough, shortness of breath.  Former smoker.  CT ANGIOGRAPHY CHEST  Technique:  Multidetector CT imaging of the chest using the standard protocol during bolus administration of intravenous contrast. Multiplanar reconstructed images including MIPs were obtained and reviewed to evaluate the vascular anatomy.  Because of scanner malfunction during the initial imaging sequence, the images were repeated.  A diagnostic study was obtained.  Contrast:  120 ml Omnipaque 350 IV total.  Comparison: None.  Findings: Initial contrast opacification was very good.  The repeated images after the scanner malfunctioned have moderate contrast opacification of the pulmonary arteries.  However, the study is diagnostic.  Filling defects within the distal right and left main pulmonary arteries extending into branches of the left upper lobe pulmonary artery, branches of the left lower lobe pulmonary artery, is of the right upper lobe pulmonary artery, the interlobar pulmonary artery, and branches of the right middle and lower lobe pulmonary arteries. Clot burden is moderate to large.  There is evidence of right heart strain, as the right ventricular diameter is greater than the left ventricular diameter.  Heart size upper normal to slightly enlarged.  No pericardial effusion.  No visible coronary atherosclerosis.  No visible atherosclerosis involving the thoracic or upper abdominal aorta.  Mild atelectasis in the lower lobes.   Lungs otherwise clear without localized airspace consolidation, interstitial disease, parenchymal nodules or masses, or evidence of pulmonary infarction.  No pleural effusions.  Central airways patent without significant bronchial wall thickening.  No significant mediastinal, hilar, or axillary lymphadenopathy. Thyroid gland mildly enlarged without nodularity.  Visualized extreme upper abdomen unremarkable.  Bone window images unremarkable.    IMPRESSION:  1.  Bilateral pulmonary emboli with moderate to large clot burden. Evidence of right heart strain. 2.  Mild atelectasis in the lower lobes.  No acute cardiopulmonary disease otherwise. 3.  Mild thyroid gland enlargement without nodularity.  These results were called by telephone on 09/08/2012 at 2043 hours to Dr. Estell Harpin of the emergency department, who verbally acknowledged these results.   Original Report Authenticated By: Hulan Saas, M.D.   US Venous Img Lower Bilateral  09/09/2012   *RADIOLOGY REPORT*  Clinical Data: History of prior DVT and pulmonary embolism  BILATERAL LOWER EXTREMITY VENOUS DUPLEX ULTRASOUND  Technique:  Gray-scale sonography with graded compression, as well as color Doppler and duplex ultrasound, were performed to evaluate the deep venous system of both lower extremities from the level of the common femoral vein through the popliteal and proximal calf veins.  Spectral Doppler was  utilized to evaluate flow at rest and with distal augmentation maneuvers.  Comparison:  Chest CT - 09/08/2012  Findings:  There is a minimal amount of nonocclusive slightly hyperechoic thrombus within the right popliteal vein (images 20 through 23).  Otherwise, normal compressibility of bilateral common femoral, superficial femoral, and left popliteal veins is demonstrated, as well as the visualized proximal calf veins.  No filling defects to suggest DVT on grayscale or color Doppler imaging.  IMPRESSION:  1.  Very minimal amount of nonocclusive thrombus  within the right popliteal vein.  2. Otherwise, no evidence of DVT within either lower extremity.   Original Report Authenticated By: Tacey Ruiz, MD   Dg Chest Port 1 View  09/08/2012   *RADIOLOGY REPORT*  Clinical Data: Chest pain  PORTABLE CHEST - 1 VIEW  Comparison: None.  Findings: Normal mediastinum and cardiac silhouette.  Normal pulmonary  vasculature.  No evidence of effusion, infiltrate, or pneumothorax.  No acute bony abnormality.  IMPRESSION: No acute cardiopulmonary process.   Original Report Authenticated By: Genevive Bi, M.D.      Impression: Mr. Mengel recently had Bilateral pulmonary emboli with moderate to large clot burden with evidence of right heart strain and evidence of mild RLE DVT. Filling defects was found within the distal right and left main pulmonary arteries extending into branches of the left upper lobe pulmonary artery, branches of the left lower lobe pulmonary artery among others.  He  Clot burden was  by no means trivial. This happened spontaneously in a young man who lives in active lifestyle.  Obviously smoking and obesity are risk factors for thromboembolism.  Hypercoagulability workup has been negative so far in the absence of prothrombin gene mutation.  Given the seriousness of this first episode,patient is a candidate for long-term anticoagulation by opinion .    Recommendations: 1. I will order prothrombin gene mutation. 2. Given  relatively no identifiable hypercoagulability disorder in the absence of prothrombin gene mutation test, to explain  thrombosis I think that a CT of the abdomen/Pelvis to rule out possible malignancy even though this is unlikely is not unreasonable. 3. Patient will return to clinic in 3 months  4.I had a very long discussion with him, I recommend that he is an the severity of the initial episode, the patient been on very long-term anticoagulation more than 6 months.  Exact duration is unclear in absence of supportive data on this.   However I will leave this to the discretion of the attending physician.  I counseled him on smoking cessation and weight loss as this may reduce his risk further.  I also educated the patient on down side of Xarelto and to avoid high-risk behavior that may put him at increased risk of trauma and bleeding including but not limited to contact sports and motorcycle riding.  I made him aware that Xarelto has no specific antidote and that intracranial bleeding while on Xarelto is potentially fatal.  He verbalized understanding. Based on some data from [facility] trial d-dimer can sometimes can be used determining risk  Of recurrent thrombosis is after stopping anticoagulant. I his case the degree of initial episode makes a strong case for prolonged duration of anticoagulant more than the usual 3-6 months.     All questions were satisfactorily answered.  I spent 35 minutes counseling the patient face to face. The total time spent in the appointment was 60 minutes.   Sherral Hammers, MD FACP. Hematology/Oncology.     Marland Kitchen

## 2012-10-01 NOTE — Patient Instructions (Addendum)
.  [facility] Discharge Instructions  RECOMMENDATIONS MADE BY THE CONSULTANT AND ANY TEST RESULTS WILL BE SENT TO YOUR REFERRING PHYSICIAN.  EXAM FINDINGS BY THE PHYSICIAN TODAY AND SIGNS OR SYMPTOMS TO REPORT TO CLINIC OR PRIMARY PHYSICIAN: Dr. Beckie Busing explained the suggested treatment of blood clots. Very controversial - can be anywhere from 6 months to several years Your clot was very serious and no apparent reason for it.  MEDICATIONS PRESCRIBED:  Continue the xarelto Don't run out!  INSTRUCTIONS GIVEN AND DISCUSSED: Weight loss and stopping smoking will lower your risks  SPECIAL INSTRUCTIONS/FOLLOW-UP: 3 months  Thank you for choosing [facility] to provide your oncology and hematology care.  To afford each patient quality time with our providers, please arrive at least 15 minutes before your scheduled appointment time.  With your help, our goal is to use those 15 minutes to complete the necessary work-up to ensure our physicians have the information they need to help with your evaluation and healthcare recommendations.    Effective January 1st, 2014, we ask that you re-schedule your appointment with our physicians should you arrive 10 or more minutes late for your appointment.  We strive to give you quality time with our providers, and arriving late affects you and other patients whose appointments are after yours.    Again, thank you for choosing [facility].  Our hope is that these requests will decrease the amount of time that you wait before being seen by our physicians.       _____________________________________________________________  Should you have questions after your visit to [facility], please contact our office at [phone] between the hours of 8:30 a.m. and 5:00 p.m.  Voicemails left after 4:30 p.m. will not be returned until the following business day.  For prescription refill requests, have your pharmacy  contact our office with your prescription refill request.

## 2012-10-05 ENCOUNTER — Ambulatory Visit (INDEPENDENT_AMBULATORY_CARE_PROVIDER_SITE_OTHER): Payer: Self-pay | Admitting: Internal Medicine

## 2012-10-05 ENCOUNTER — Encounter: Payer: Self-pay | Admitting: Internal Medicine

## 2012-10-05 VITALS — BP 130/94 | HR 78 | Temp 98.0°F | Ht 71.0 in | Wt 264.0 lb

## 2012-10-05 DIAGNOSIS — R079 Chest pain, unspecified: Secondary | ICD-10-CM

## 2012-10-05 DIAGNOSIS — I2699 Other pulmonary embolism without acute cor pulmonale: Secondary | ICD-10-CM

## 2012-10-05 LAB — PROTHROMBIN GENE MUTATION

## 2012-10-05 NOTE — Progress Notes (Signed)
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 Patient was set up with primary care physician. 2. He'll followup with the hematology clinic for further management of his pulmonary embolism and followup of his hypercoagulable panel. 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   Pt   wants to go back to work.  No obvious daytime variabilty or assoc chronic cough or cp or chest tightness, subjective wheeze overt sinus or hb symptoms. No unusual exp hx or h/o childhood pna/ asthma or knowledge of premature birth.   Sleeping ok without nocturnal  or early am exacerbation  of respiratory  c/o's or need for noct saba. Also denies any obvious fluctuation of symptoms with weather or environmental changes or other aggravating or alleviating factors except as outlined above    Review of Systems  Constitutional: Negative for fever, chills, diaphoresis, activity change, appetite change, fatigue and unexpected weight change.  HENT: Negative for hearing loss, ear pain, nosebleeds, congestion, sore throat, facial swelling, rhinorrhea, sneezing, mouth sores, trouble swallowing, neck pain, neck stiffness, dental problem, voice change, postnasal drip, sinus pressure, tinnitus and ear discharge.   Eyes: Negative for photophobia, discharge, itching and visual disturbance.  Respiratory: Positive for chest tightness and  shortness of breath. Negative for apnea, cough, choking, wheezing and stridor.   Cardiovascular: Positive for chest pain. Negative for palpitations and leg swelling.  Gastrointestinal: Negative for nausea, vomiting, abdominal pain, constipation, blood in stool and abdominal distention.  Genitourinary: Negative for dysuria, urgency, frequency, hematuria, flank pain, decreased urine volume and difficulty urinating.  Musculoskeletal: Negative for myalgias, back pain, joint swelling, arthralgias and gait problem.  Skin: Negative for color change, pallor and rash.  Neurological: Negative for dizziness, tremors, seizures, syncope, speech difficulty, weakness, light-headedness, numbness and headaches.  Hematological: Negative for adenopathy. Does not bruise/bleed easily.  Psychiatric/Behavioral: Negative for confusion, sleep disturbance and agitation. The patient is not nervous/anxious.        Objective:   Physical Exam  Wt Readings from Last 3 Encounters:  10/05/12 264 lb (119.75 kg)  10/01/12 259 lb (117.482 kg)  09/10/12 257 lb 11.5 oz (116.9 kg)    amb obese  bm nad HEENT: nl dentition, turbinates, and orophanx. Nl external ear canals without cough reflex   NECK :  without JVD/Nodes/TM/ nl carotid upstrokes bilaterally   LUNGS: no acc muscle use, clear to A and P bilaterally without cough on insp or exp maneuvers   CV:  RRR  no s3 or murmur or increase in P2, no edema   ABD:  soft and nontender with nl excursion in the supine position. No bruits or organomegaly, bowel sounds nl  MS:  warm without deformities, calf tenderness, cyanosis or clubbing  SKIN: warm and dry without lesions    NEURO:  alert, approp, no deficits      Assessment & Plan:

## 2012-10-05 NOTE — Patient Instructions (Addendum)
Weight control is simply a matter of calorie balance which needs to be tilted in your favor by eating less and exercising more.  To get the most out of exercise, you need to be continuously aware that you are short of breath, but never out of breath, for 30 minutes daily. As you improve, it will actually be easier for you to do the same amount of exercise  in  30 minutes so always push to the level where you are short of breath.  If this does not result in gradual weight reduction then I strongly recommend you see a nutritionist with a food diary x 2 weeks so that we can work out a negative calorie balance which is universally effective in steady weight loss programs.  Think of your calorie balance like you do your bank account where in this case you want the balance to go down so you must take in less calories than you burn up.  It's just that simple:  Hard to do, but easy to understand.  Good luck!   Please see patient coordinator before you leave today  to schedule echocardiogram at [facility] - wait until you hear from [place] to start vigorous exercise

## 2012-10-05 NOTE — Assessment & Plan Note (Signed)
Dx 09/08/2012   PE (pulmonary embolism)  Obesity, unspecified  Thrombocytopenia, unspecified  Right leg DVT - See Echo 09/09/12 > RVE so rec recheck 10/05/2012  Before increasing activity  Followed also by Hematology so the only issue for pulmonary f/u is whether there is sign enough PAH to change activity recs at this point and since he is anxious to return to work reasonable to repeat the echo now and then again at 6 months if not normalizing.  Also will need repeat venous dopplers if/ when decision is made to d/c xarelto

## 2012-10-07 ENCOUNTER — Ambulatory Visit (HOSPITAL_COMMUNITY)
Admission: RE | Admit: 2012-10-07 | Discharge: 2012-10-07 | Disposition: A | Discharge status: Home / Self Care | Payer: Self-pay | Source: Ambulatory Visit | Attending: Hematology and Oncology | Admitting: Hematology and Oncology

## 2012-10-07 DIAGNOSIS — I2699 Other pulmonary embolism without acute cor pulmonale: Secondary | ICD-10-CM | POA: Insufficient documentation

## 2012-10-07 MED ORDER — IOHEXOL 300 MG/ML  SOLN
100.0000 mL | Freq: Once | INTRAMUSCULAR | Status: AC | PRN
  Administered 2012-10-07: 100 mL via INTRAVENOUS

## 2012-10-09 ENCOUNTER — Ambulatory Visit (HOSPITAL_COMMUNITY): Admission: RE | Admit: 2012-10-09 | Payer: Self-pay | Source: Ambulatory Visit

## 2013-01-01 ENCOUNTER — Encounter (HOSPITAL_COMMUNITY): Payer: Self-pay | Attending: Hematology and Oncology

## 2013-01-01 ENCOUNTER — Encounter (HOSPITAL_COMMUNITY): Payer: Self-pay

## 2013-01-01 VITALS — BP 131/71 | HR 92 | Temp 98.6°F | Resp 15 | Wt 248.9 lb

## 2013-01-01 DIAGNOSIS — Z7901 Long term (current) use of anticoagulants: Secondary | ICD-10-CM

## 2013-01-01 DIAGNOSIS — I2699 Other pulmonary embolism without acute cor pulmonale: Secondary | ICD-10-CM

## 2013-01-01 NOTE — Patient Instructions (Addendum)
[facility] Discharge Instructions  RECOMMENDATIONS MADE BY THE CONSULTANT AND ANY TEST RESULTS WILL BE SENT TO YOUR REFERRING PHYSICIAN.  Return to clinic in 6 months for follow-up. Report any issues/concerns to clinic as needed prior to appointments.  Thank you for choosing [facility] to provide your oncology and hematology care.  To afford each patient quality time with our providers, please arrive at least 15 minutes before your scheduled appointment time.  With your help, our goal is to use those 15 minutes to complete the necessary work-up to ensure our physicians have the information they need to help with your evaluation and healthcare recommendations.    Effective January 1st, 2014, we ask that you re-schedule your appointment with our physicians should you arrive 10 or more minutes late for your appointment.  We strive to give you quality time with our providers, and arriving late affects you and other patients whose appointments are after yours.    Again, thank you for choosing [facility].  Our hope is that these requests will decrease the amount of time that you wait before being seen by our physicians.       _____________________________________________________________  Should you have questions after your visit to [facility], please contact our office at [phone] between the hours of 8:30 a.m. and 5:00 p.m.  Voicemails left after 4:30 p.m. will not be returned until the following business day.  For prescription refill requests, have your pharmacy contact our office with your prescription refill request.

## 2013-01-01 NOTE — Progress Notes (Signed)
[facility] [phone]   [phone]  OFFICE PROGRESS NOTE  No PCP Per Patient [address] [place] [address]  DIAGNOSIS: Pulmonary embolism   INTERVAL HISTORY:   Mr. Kissoon is a 27 year old Man was hospitalized in May of 2014 for a fairly large bilateral pulmonary embolism involving the main pulmonary arteries. This was spontaneous and patient prior to this was reportedly very active physically even though he is a smoker.there was evidence of right heart strain impression was treated with anticoagulation initially with Lovenox and was transitioned to Xarelto.  While in the hospital he had extensive hypercoagulability workup, including protein C and protein S, antithrombin III, factor V Leiden,antiphospholipid antibodies which were all negative. I was not able to find prothrombin gene mutation, so I ordered this at his initial visit and this came back negative. I also did a CT of the abdomen and pelvis which was negative for any evidence of malignancy. I recommended long term anticoagulation beyond the usual 6 months given how severe this 1st episode was.  He returns to the clinic today for scheduled follow  Up . He tell me that he is taking Xarelto as instructed. He continues to smoke despite my counseling him not to. He denies any easy bruising or bleeding abnormalities. He has noted that since being on Xarelto when he injures himself that he bleeds a little longer than before.     REVIEW OF SYSTEMS: As in the history above otherwise negative.  PHYSICAL EXAMINATION:  Blood pressure 131/71, pulse 92, temperature 98.6 F (37 C), temperature source Oral, resp. rate 15, weight 248 lb 14.4 oz (112.9 kg). GENERAL: No acute distress. SKIN:  No rashes or significant lesions . No ecchymosis or petechial rash. HEAD: Normocephalic, No masses, lesions, tenderness or abnormalities  LUNGS: Clear to auscultation , no crackles or wheezes HEART: regular  rate & rhythm, no murmurs, no gallops, S1 normal and S2 normal and no S3. EXTREMITIES: No edema, no skin discoloration or tenderness NEURO: Alert & oriented , no focal motor  deficits.     LABORATORY DATA: Lab Results  Component Value Date   WBC 6.5 09/10/2012   HGB 15.6 09/10/2012   HCT 44.3 09/10/2012   MCV 86.5 09/10/2012   PLT 137* 09/10/2012      Chemistry      Component Value Date/Time   NA 140 09/09/2012 0549   K 4.0 09/09/2012 0549   CL 106 09/09/2012 0549   CO2 22 09/09/2012 0549   BUN 13 09/09/2012 0549   CREATININE 0.95 09/09/2012 0549      Component Value Date/Time   CALCIUM 8.9 09/09/2012 0549   ALKPHOS 62 09/09/2012 0549   AST 15 09/09/2012 0549   ALT 18 09/09/2012 0549   BILITOT 0.3 09/09/2012 0549       RADIOGRAPHIC STUDIES: No results found.   ASSESSMENT:  Bilateral pulmonary emboli with moderate to large clot burden with evidence of right heart strain and evidence of mild RLE DVT.  Hypercoagulability workup sulfur has been negative.  Patient is tolerating Xarelto well.    PLAN:  1. I recommend very long time to lifelong anticoagulation given the severity of the initial episode which was also significant hemodynamically . 2. He will return to clinic in 6 months for routine follow up. 3. I counseled him again on smoking cessation 4. He will continue Xarelto as prescribed . I instructed him to call before his prescription runs out if he  needs a refill.     All questions were satisfactorily answered. Patient knows to call if  any concern arises.  I spent more than 50 % counseling the patient face to face. The total time spent in the appointment was 15 minutes.   Sherral Hammers, MD FACP. Hematology/Oncology.

## 2013-07-02 ENCOUNTER — Encounter (HOSPITAL_COMMUNITY): Payer: Self-pay

## 2013-07-02 ENCOUNTER — Ambulatory Visit (HOSPITAL_COMMUNITY): Payer: Self-pay

## 2013-07-03 NOTE — Progress Notes (Signed)
This encounter was created in error - please disregard.

## 2014-01-10 ENCOUNTER — Emergency Department (HOSPITAL_COMMUNITY)
Admission: EM | Admit: 2014-01-10 | Discharge: 2014-01-10 | Disposition: A | Discharge status: Home / Self Care | Payer: Self-pay | Attending: Emergency Medicine | Admitting: Emergency Medicine

## 2014-01-10 ENCOUNTER — Encounter (HOSPITAL_COMMUNITY): Payer: Self-pay | Admitting: Emergency Medicine

## 2014-01-10 DIAGNOSIS — Z86711 Personal history of pulmonary embolism: Secondary | ICD-10-CM | POA: Insufficient documentation

## 2014-01-10 DIAGNOSIS — Z202 Contact with and (suspected) exposure to infections with a predominantly sexual mode of transmission: Secondary | ICD-10-CM | POA: Insufficient documentation

## 2014-01-10 DIAGNOSIS — Z711 Person with feared health complaint in whom no diagnosis is made: Secondary | ICD-10-CM

## 2014-01-10 DIAGNOSIS — Z7901 Long term (current) use of anticoagulants: Secondary | ICD-10-CM | POA: Insufficient documentation

## 2014-01-10 DIAGNOSIS — Z87891 Personal history of nicotine dependence: Secondary | ICD-10-CM | POA: Insufficient documentation

## 2014-01-10 NOTE — ED Provider Notes (Signed)
CSN: [id number]     Arrival date & time 01/10/14  1738 History  This chart was scribed for Burgess Amor, PA with Donnetta Hutching, MD by Tonye Royalty, ED Scribe. This patient was seen in room APFT21/APFT21 and the patient's care was started at 6:57 PM.    Chief Complaint  Patient presents with  . Exposure to STD   The history is provided by the patient. No language interpreter was used.   HPI Comments: Cristian Flores is a 28 y.o. male who presents to the Emergency Department for STD testing because his girlfriend was diagnosed recently HSV infection. He is unsure of her symptoms, is unable to say if she has/had labial or genital infection.  He denies ever noticing cold sores to her lips, and he denies ever having any cold sores as well. He reports his girlfriend went to see the doctor because she was having vaginal discharge. He reports a small red bump that is not painful to his groin area with onset a few days ago. He denies penile discharge or fevers.   He reports pulmonary embolism last year for which he was prescribed Xarelto; he states he has since stopped using Xarelto because of its price; he states testing did not identify a reason for his blood clots. He states he has not seen the doctor to follow up in several months.  Past Medical History  Diagnosis Date  . Pulmonary embolism May 2014   History reviewed. No pertinent past surgical history. Family History  Problem Relation Age of Onset  . Diabetes Father   . Hypertension Father    History  Substance Use Topics  . Smoking status: Former Smoker -- 2 years    Types: Cigarettes, Cigars    Quit date: 10/01/2012  . Smokeless tobacco: Never Used  . Alcohol Use: 3.5 oz/week    7 drink(s) per week    Review of Systems  Constitutional: Negative for fever.  HENT: Negative for congestion and sore throat.        Denies cold sores  Eyes: Negative.   Respiratory: Negative for chest tightness and shortness of breath.   Cardiovascular: Negative  for chest pain.  Gastrointestinal: Negative for nausea and abdominal pain.  Genitourinary: Negative.  Negative for dysuria, discharge, penile swelling, scrotal swelling and penile pain.       Small red non-painful bump to his groin area  Musculoskeletal: Negative for arthralgias, joint swelling and neck pain.  Skin: Negative.  Negative for rash and wound.  Neurological: Negative for dizziness, weakness, light-headedness, numbness and headaches.  Psychiatric/Behavioral: Negative.       Allergies  Review of patient's allergies indicates no known allergies.  Home Medications   Prior to Admission medications   Medication Sig Start Date End Date Taking? Authorizing Provider  Rivaroxaban (XARELTO) 20 MG TABS Take 1 tablet (20 mg total) by mouth daily. Start taking this medication once Xarelto  tabs are complete 09/10/12   Erick Blinks, MD   BP 150/82  Pulse 84  Temp(Src) 100 F (37.8 C)  Resp 18  Ht  (1.803 m)  Wt 254 lb (115.214 kg)  BMI 35.44 kg/m2  SpO2 98% Physical Exam  Nursing note and vitals reviewed. Constitutional: He appears well-developed and well-nourished.  HENT:  Head: Normocephalic and atraumatic.  Eyes: Conjunctivae are normal.  Neck: Normal range of motion.  Cardiovascular: Normal rate, regular rhythm, normal heart sounds and intact distal pulses.   Pulmonary/Chest: Effort normal and breath sounds normal. He has  no wheezes.  Abdominal: Soft. Bowel sounds are normal. There is no tenderness.  Genitourinary: Penis normal. Circumcised. No penile tenderness. No discharge found.  1mm slightly raised papule on shaft of penis.  No vesicles.  Musculoskeletal: Normal range of motion.  Neurological: He is alert.  Skin: Skin is warm and dry.  Psychiatric: He has a normal mood and affect.    ED Course  Procedures (including critical care time) Labs Review Labs Reviewed  HSV 2 ANTIBODY, IGG - Abnormal; Notable for the following:    HSV 2 Glycoprotein G Ab,  IgG 2.14 (*)    All other components within normal limits  GC/CHLAMYDIA PROBE AMP  RPR  HIV ANTIBODY (ROUTINE TESTING)    Imaging Review No results found.   EKG Interpretation None     DIAGNOSTIC STUDIES: Oxygen Saturation is 98% on room air, normal by my interpretation.    COORDINATION OF CARE: 7:17 PM Discussed treatment plan, including gonorrhea/chlamydia test and recommendation to follow up for pulmonary embolism- (he was followed for this here by hematology), the patient agrees with the plan and has no further questions at this time.   MDM   Final diagnoses:  Concern about STD in male without diagnosis   Pt was screened for std's, including gc/chlamydia, rpr, HIV and hsv2 antibody also collected.  Pt understands will be contacted if cultures positive.  No signs of active disease on todays exam.  I personally performed the services described in this documentation, which was scribed in my presence. The recorded information has been reviewed and is accurate.   Burgess Amor, PA-C 01/12/14 1507

## 2014-01-10 NOTE — Discharge Instructions (Signed)
As discussed you will be notified if any of your tests are positive.
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
## 2014-01-11 LAB — HIV ANTIBODY (ROUTINE TESTING W REFLEX): HIV: NONREACTIVE

## 2014-01-11 LAB — RPR

## 2014-01-12 LAB — GC/CHLAMYDIA PROBE AMP
CT Probe RNA: NEGATIVE
GC PROBE AMP APTIMA: NEGATIVE

## 2014-01-12 LAB — HSV 2 ANTIBODY, IGG: HSV 2 Glycoprotein G Ab, IgG: 2.14 IV — ABNORMAL HIGH

## 2014-01-14 NOTE — ED Provider Notes (Signed)
Medical screening examination/treatment/procedure(s) were performed by non-physician practitioner and as supervising physician I was immediately available for consultation/collaboration.   EKG Interpretation None       Bethany Cumming, MD 01/14/14 2013 

## 2014-11-29 ENCOUNTER — Emergency Department (HOSPITAL_COMMUNITY)
Admission: EM | Admit: 2014-11-29 | Discharge: 2014-11-30 | Disposition: A | Discharge status: Home / Self Care | Payer: Self-pay | Attending: Emergency Medicine | Admitting: Emergency Medicine

## 2014-11-29 ENCOUNTER — Encounter (HOSPITAL_COMMUNITY): Payer: Self-pay | Admitting: *Deleted

## 2014-11-29 DIAGNOSIS — T23212A Burn of second degree of left thumb (nail), initial encounter: Secondary | ICD-10-CM | POA: Insufficient documentation

## 2014-11-29 DIAGNOSIS — Z87891 Personal history of nicotine dependence: Secondary | ICD-10-CM | POA: Insufficient documentation

## 2014-11-29 DIAGNOSIS — T310 Burns involving less than 10% of body surface: Secondary | ICD-10-CM

## 2014-11-29 DIAGNOSIS — Z86711 Personal history of pulmonary embolism: Secondary | ICD-10-CM | POA: Insufficient documentation

## 2014-11-29 DIAGNOSIS — T2601XA Burn of right eyelid and periocular area, initial encounter: Secondary | ICD-10-CM | POA: Insufficient documentation

## 2014-11-29 DIAGNOSIS — X12XXXA Contact with other hot fluids, initial encounter: Secondary | ICD-10-CM | POA: Insufficient documentation

## 2014-11-29 DIAGNOSIS — Y998 Other external cause status: Secondary | ICD-10-CM | POA: Insufficient documentation

## 2014-11-29 DIAGNOSIS — Y9289 Other specified places as the place of occurrence of the external cause: Secondary | ICD-10-CM | POA: Insufficient documentation

## 2014-11-29 DIAGNOSIS — Y9389 Activity, other specified: Secondary | ICD-10-CM | POA: Insufficient documentation

## 2014-11-29 DIAGNOSIS — T2122XA Burn of second degree of abdominal wall, initial encounter: Secondary | ICD-10-CM | POA: Insufficient documentation

## 2014-11-29 MED ORDER — HYDROMORPHONE HCL 1 MG/ML IJ SOLN
1.0000 mg | INTRAMUSCULAR | Status: DC | PRN
  Administered 2014-11-29 (×2): 1 mg via INTRAVENOUS
  Filled 2014-11-29 (×2): qty 1

## 2014-11-29 MED ORDER — HYDROMORPHONE HCL 1 MG/ML IJ SOLN
INTRAMUSCULAR | Status: AC
  Administered 2014-11-29: 1 mg via INTRAVENOUS
  Filled 2014-11-29: qty 1

## 2014-11-29 MED ORDER — HYDROMORPHONE HCL 1 MG/ML IJ SOLN: 1.0000 mg | Freq: Once | INTRAMUSCULAR | Status: AC

## 2014-11-29 MED ORDER — SILVER SULFADIAZINE 1 % EX CREA: 1.0000 "application " | TOPICAL_CREAM | Freq: Every day | CUTANEOUS | Status: DC

## 2014-11-29 MED ORDER — OXYCODONE-ACETAMINOPHEN 5-325 MG PO TABS: 1.0000 | ORAL_TABLET | Freq: Four times a day (QID) | ORAL | Status: DC | PRN

## 2014-11-29 MED ORDER — SILVER SULFADIAZINE 1 % EX CREA
TOPICAL_CREAM | Freq: Once | CUTANEOUS | Status: AC
  Administered 2014-11-29: via TOPICAL
  Filled 2014-11-29: qty 50

## 2014-11-29 MED ORDER — OXYCODONE-ACETAMINOPHEN 5-325 MG PO TABS: 2.0000 | ORAL_TABLET | ORAL | Status: DC | PRN

## 2014-11-29 NOTE — Discharge Instructions (Signed)
Burn Care Your skin is a natural barrier to infection. It is the largest organ of your body. Burns damage this natural protection. To help prevent infection, it is very important to follow your caregiver's instructions in the care of your burn. Burns are classified as:  First degree. There is only redness of the skin (erythema). No scarring is expected.  Second degree. There is blistering of the skin. Scarring may occur with deeper burns.  Third degree. All layers of the skin are injured, and scarring is expected. HOME CARE INSTRUCTIONS   Wash your hands well before changing your bandage.  Change your bandage as often as directed by your caregiver.  Remove the old bandage. If the bandage sticks, you may soak it off with cool, clean water.  Cleanse the burn thoroughly but gently with mild soap and water.  Pat the area dry with a clean, dry cloth.  Apply a thin layer of antibacterial cream to the burn.  Apply a clean bandage as instructed by your caregiver.  Keep the bandage as clean and dry as possible.  Elevate the affected area for the first 24 hours, then as instructed by your caregiver.  Only take over-the-counter or prescription medicines for pain, discomfort, or fever as directed by your caregiver. SEEK IMMEDIATE MEDICAL CARE IF:   You develop excessive pain.  You develop redness, tenderness, swelling, or red streaks near the burn.  The burned area develops yellowish-white fluid (pus) or a bad smell.  You have a fever. MAKE SURE YOU:   Understand these instructions.  Will watch your condition.  Will get help right away if you are not doing well or get worse. Document Released: 04/08/2005 Document Revised: 07/01/2011 Document Reviewed: 08/29/2010 Exit[employer] Patient Information 2015 [employer]. This information is not intended to replace advice given to you by your health care provider. Make sure you discuss any questions you have with your health care  provider.  

## 2014-11-29 NOTE — ED Notes (Signed)
After NS soaked dressing patient's pain has decreased to 6. Patient is visibly calmer.
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
## 2014-11-29 NOTE — ED Notes (Signed)
Patient visibly shacking saying he is cold. States wants more medication for pain.

## 2014-11-30 NOTE — ED Notes (Signed)
Pt drowsy, will arouse when spoken to, update given to pt and family at bedside,

## 2014-11-30 NOTE — ED Notes (Signed)
Pt drowsy, will arouse when spoken to, family at bedside,

## 2014-12-01 ENCOUNTER — Encounter (HOSPITAL_COMMUNITY): Payer: Self-pay

## 2014-12-01 ENCOUNTER — Emergency Department (HOSPITAL_COMMUNITY)
Admission: EM | Admit: 2014-12-01 | Discharge: 2014-12-01 | Disposition: A | Discharge status: Home / Self Care | Payer: Self-pay | Attending: Emergency Medicine | Admitting: Emergency Medicine

## 2014-12-01 DIAGNOSIS — Y9389 Activity, other specified: Secondary | ICD-10-CM | POA: Insufficient documentation

## 2014-12-01 DIAGNOSIS — Z86711 Personal history of pulmonary embolism: Secondary | ICD-10-CM | POA: Insufficient documentation

## 2014-12-01 DIAGNOSIS — Z87891 Personal history of nicotine dependence: Secondary | ICD-10-CM | POA: Insufficient documentation

## 2014-12-01 DIAGNOSIS — T2121XD Burn of second degree of chest wall, subsequent encounter: Secondary | ICD-10-CM | POA: Insufficient documentation

## 2014-12-01 DIAGNOSIS — Y9289 Other specified places as the place of occurrence of the external cause: Secondary | ICD-10-CM | POA: Insufficient documentation

## 2014-12-01 DIAGNOSIS — X58XXXD Exposure to other specified factors, subsequent encounter: Secondary | ICD-10-CM | POA: Insufficient documentation

## 2014-12-01 DIAGNOSIS — Y998 Other external cause status: Secondary | ICD-10-CM | POA: Insufficient documentation

## 2014-12-01 MED ORDER — HYDROMORPHONE HCL 2 MG PO TABS
2.0000 mg | ORAL_TABLET | ORAL | Status: DC | PRN
Start: 2014-12-01 — End: 2014-12-13

## 2014-12-01 MED ORDER — NAPROXEN 500 MG PO TABS: 500.0000 mg | ORAL_TABLET | Freq: Two times a day (BID) | ORAL | Status: DC

## 2014-12-01 MED ORDER — IBUPROFEN 800 MG PO TABS
800.0000 mg | ORAL_TABLET | Freq: Once | ORAL | Status: AC
  Administered 2014-12-01: 800 mg via ORAL
  Filled 2014-12-01: qty 1

## 2014-12-01 MED ORDER — HYDROMORPHONE HCL 2 MG/ML IJ SOLN
2.0000 mg | Freq: Once | INTRAMUSCULAR | Status: AC
  Administered 2014-12-01: 2 mg via INTRAMUSCULAR
  Filled 2014-12-01: qty 1

## 2014-12-01 MED ORDER — SILVER SULFADIAZINE 1 % EX CREA
TOPICAL_CREAM | Freq: Once | CUTANEOUS | Status: AC
  Administered 2014-12-01: 03:00:00 via TOPICAL
  Filled 2014-12-01: qty 50

## 2014-12-01 MED ORDER — ONDANSETRON 8 MG PO TBDP
8.0000 mg | ORAL_TABLET | Freq: Once | ORAL | Status: AC
  Administered 2014-12-01: 8 mg via ORAL
  Filled 2014-12-01: qty 1

## 2014-12-01 NOTE — ED Notes (Signed)
Pt resting with eyes closed, will arouse when spoken to, is calling for ride home,

## 2014-12-01 NOTE — ED Notes (Signed)
Dr Glick at bedside,  
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
## 2014-12-01 NOTE — Discharge Instructions (Signed)
Burn Care Your skin is a natural barrier to infection. It is the largest organ of your body. Burns damage this natural protection. To help prevent infection, it is very important to follow your caregiver's instructions in the care of your burn. Burns are classified as:  First degree. There is only redness of the skin (erythema). No scarring is expected.  Second degree. There is blistering of the skin. Scarring may occur with deeper burns.  Third degree. All layers of the skin are injured, and scarring is expected. HOME CARE INSTRUCTIONS   Wash your hands well before changing your bandage.  Change your bandage as often as directed by your caregiver.  Remove the old bandage. If the bandage sticks, you may soak it off with cool, clean water.  Cleanse the burn thoroughly but gently with mild soap and water.  Pat the area dry with a clean, dry cloth.  Apply a thin layer of antibacterial cream to the burn.  Apply a clean bandage as instructed by your caregiver.  Keep the bandage as clean and dry as possible.  Elevate the affected area for the first 24 hours, then as instructed by your caregiver.  Only take over-the-counter or prescription medicines for pain, discomfort, or fever as directed by your caregiver. SEEK IMMEDIATE MEDICAL CARE IF:   You develop excessive pain.  You develop redness, tenderness, swelling, or red streaks near the burn.  The burned area develops yellowish-white fluid (pus) or a bad smell.  You have a fever. MAKE SURE YOU:   Understand these instructions.  Will watch your condition.  Will get help right away if you are not doing well or get worse. Document Released: 04/08/2005 Document Revised: 07/01/2011 Document Reviewed: 08/29/2010 [facility] Patient Information 2015 [place], [place]. This information is not intended to replace advice given to you by your health care provider. Make sure you discuss any questions you have with your health care  provider.  Naproxen and naproxen sodium oral immediate-release tablets What is this medicine? NAPROXEN (na PROX en) is a non-steroidal anti-inflammatory drug (NSAID). It is used to reduce swelling and to treat pain. This medicine may be used for dental pain, headache, or painful monthly periods. It is also used for painful joint and muscular problems such as arthritis, tendinitis, bursitis, and gout. This medicine may be used for other purposes; ask your health care provider or pharmacist if you have questions. COMMON BRAND NAME(S): Aflaxen, Aleve, Aleve Arthritis, All Day Relief, Anaprox, Anaprox DS, Naprosyn What should I tell my health care provider before I take this medicine? They need to know if you have any of these conditions: -asthma -cigarette smoker -drink more than 3 alcohol containing drinks a day -heart disease or circulation problems such as heart failure or leg edema (fluid retention) -high blood pressure -kidney disease -liver disease -stomach bleeding or ulcers -an unusual or allergic reaction to naproxen, aspirin, other NSAIDs, other medicines, foods, dyes, or preservatives -pregnant or trying to get pregnant -breast-feeding How should I use this medicine? Take this medicine by mouth with a glass of water. Follow the directions on the prescription label. Take it with food if your stomach gets upset. Try to not lie down for at least 10 minutes after you take it. Take your medicine at regular intervals. Do not take your medicine more often than directed. Long-term, continuous use may increase the risk of heart attack or stroke. A special MedGuide will be given to you by the pharmacist with each prescription and refill. Be  sure to read this information carefully each time. Talk to your pediatrician regarding the use of this medicine in children. Special care may be needed. Overdosage: If you think you have taken too much of this medicine contact a poison control center or  emergency room at once. NOTE: This medicine is only for you. Do not share this medicine with others. What if I miss a dose? If you miss a dose, take it as soon as you can. If it is almost time for your next dose, take only that dose. Do not take double or extra doses. What may interact with this medicine? -alcohol -aspirin -cidofovir -diuretics -lithium -methotrexate -other drugs for inflammation like ketorolac or prednisone -pemetrexed -probenecid -warfarin This list may not describe all possible interactions. Give your health care provider a list of all the medicines, herbs, non-prescription drugs, or dietary supplements you use. Also tell them if you smoke, drink alcohol, or use illegal drugs. Some items may interact with your medicine. What should I watch for while using this medicine? Tell your doctor or health care professional if your pain does not get better. Talk to your doctor before taking another medicine for pain. Do not treat yourself. This medicine does not prevent heart attack or stroke. In fact, this medicine may increase the chance of a heart attack or stroke. The chance may increase with longer use of this medicine and in people who have heart disease. If you take aspirin to prevent heart attack or stroke, talk with your doctor or health care professional. Do not take other medicines that contain aspirin, ibuprofen, or naproxen with this medicine. Side effects such as stomach upset, nausea, or ulcers may be more likely to occur. Many medicines available without a prescription should not be taken with this medicine. This medicine can cause ulcers and bleeding in the stomach and intestines at any time during treatment. Do not smoke cigarettes or drink alcohol. These increase irritation to your stomach and can make it more susceptible to damage from this medicine. Ulcers and bleeding can happen without warning symptoms and can cause death. You may get drowsy or dizzy. Do not  drive, use machinery, or do anything that needs mental alertness until you know how this medicine affects you. Do not stand or sit up quickly, especially if you are an older patient. This reduces the risk of dizzy or fainting spells. This medicine can cause you to bleed more easily. Try to avoid damage to your teeth and gums when you brush or floss your teeth. What side effects may I notice from receiving this medicine? Side effects that you should report to your doctor or health care professional as soon as possible: -black or bloody stools, blood in the urine or vomit -blurred vision -chest pain -difficulty breathing or wheezing -nausea or vomiting -severe stomach pain -skin rash, skin redness, blistering or peeling skin, hives, or itching -slurred speech or weakness on one side of the body -swelling of eyelids, throat, lips -unexplained weight gain or swelling -unusually weak or tired -yellowing of eyes or skin Side effects that usually do not require medical attention (report to your doctor or health care professional if they continue or are bothersome): -constipation -headache -heartburn This list may not describe all possible side effects. Call your doctor for medical advice about side effects. You may report side effects to FDA at 1-800-FDA-1088. Where should I keep my medicine? Keep out of the reach of children. Store at room temperature between 15 and 30 degrees  C (59 and 86 degrees F). Keep container tightly closed. Throw away any unused medicine after the expiration date. NOTE: This sheet is a summary. It may not cover all possible information. If you have questions about this medicine, talk to your doctor, pharmacist, or health care provider.  2015, [employer]. (2009-04-10 20:10:16)  Hydromorphone tablets What is this medicine? HYDROMORPHONE (hye droe MOR fone) is a pain reliever. It is used to treat moderate to severe pain. This medicine may be used for other  purposes; ask your health care provider or pharmacist if you have questions. COMMON BRAND NAME(S): Dilaudid What should I tell my health care provider before I take this medicine? They need to know if you have any of these conditions: -brain tumor -drug abuse or addiction -head injury -heart disease -frequently drink alcohol containing drinks -kidney disease or problems going to the bathroom -liver disease -lung disease, asthma, or breathing problems -mental problems -an allergic or unusual reaction to lactose, hydromorphone, other opioid analgesics, other medicines, sulfites, foods, dyes, or preservatives -pregnant or trying to get pregnant -breast-feeding How should I use this medicine? Take this medicine by mouth with a glass of water. If the medicine upsets your stomach, take it with food or milk. Follow the directions on the prescription label. Do not take more medicine than you are told to take. Talk to your pediatrician regarding the use of this medicine in children. Special care may be needed. Overdosage: If you think you have taken too much of this medicine contact a poison control center or emergency room at once. NOTE: This medicine is only for you. Do not share this medicine with others. What if I miss a dose? If you miss a dose, take it as soon as you can. If it is almost time for your next dose, take only that dose. Do not take double or extra doses. What may interact with this medicine? -alcohol -antihistamines for allergy, cough and cold -medicines for anesthesia -medicines for depression, anxiety, or psychotic disturbances -medicines for sleep -muscle relaxants -naltrexone -narcotic medicines (opiates) for pain -phenothiazines like chlorpromazine, mesoridazine, prochlorperazine, thioridazine -tramadol This list may not describe all possible interactions. Give your health care provider a list of all the medicines, herbs, non-prescription drugs, or dietary supplements  you use. Also tell them if you smoke, drink alcohol, or use illegal drugs. Some items may interact with your medicine. What should I watch for while using this medicine? Tell your doctor or health care professional if your pain does not go away, if it gets worse, or if you have new or a different type of pain. You may develop tolerance to the medicine. Tolerance means that you will need a higher dose of the medicine for pain relief. Tolerance is normal and is expected if you take this medicine for a long time. Do not suddenly stop taking your medicine because you may develop a severe reaction. Your body becomes used to the medicine. This does NOT mean you are addicted. Addiction is a behavior related to getting and using a drug for a non-medical reason. If you have pain, you have a medical reason to take pain medicine. Your doctor will tell you how much medicine to take. If your doctor wants you to stop the medicine, the dose will be slowly lowered over time to avoid any side effects. You may get drowsy or dizzy. Do not drive, use machinery, or do anything that needs mental alertness until you know how this medicine affects you. Do  not stand or sit up quickly, especially if you are an older patient. This reduces the risk of dizzy or fainting spells. Alcohol may interfere with the effect of this medicine. Avoid alcoholic drinks. There are different types of narcotic medicines (opiates) for pain. If you take more than one type at the same time, you may have more side effects. Give your health care provider a list of all medicines you use. Your doctor will tell you how much medicine to take. Do not take more medicine than directed. Call emergency for help if you have problems breathing. This medicine will cause constipation. Try to have a bowel movement at least every 2 to 3 days. If you do not have a bowel movement for 3 days, call your doctor or health care professional. Your mouth may get dry. Chewing  sugarless gum or sucking hard candy, and drinking plenty of water may help. Contact your doctor if the problem does not go away or is severe. What side effects may I notice from receiving this medicine? Side effects that you should report to your doctor or health care professional as soon as possible: -allergic reactions like skin rash, itching or hives, swelling of the face, lips, or tongue -breathing problems -changes in vision -confusion -feeling faint or lightheaded, falls -seizures -slow or fast heartbeat -trouble passing urine or change in the amount of urine -trouble with balance, talking, walking -unusually weak or tired Side effects that usually do not require medical attention (report to your doctor or health care professional if they continue or are bothersome): -difficulty sleeping -drowsiness -dry mouth -flushing -headache -itching -loss of appetite -nausea, vomiting This list may not describe all possible side effects. Call your doctor for medical advice about side effects. You may report side effects to FDA at 1-800-FDA-1088. Where should I keep my medicine? Keep out of the reach of children. This medicine can be abused. Keep your medicine in a safe place to protect it from theft. Do not share this medicine with anyone. Selling or giving away this medicine is dangerous and against the law. Store at room temperature between 15 and 30 degrees C (59 and 86 degrees F). Keep container tightly closed. Protect from light. This medicine may cause accidental overdose and death if it is taken by other adults, children, or pets. Flush any unused medicine down the toilet to reduce the chance of harm. Do not use the medicine after the expiration date. NOTE: This sheet is a summary. It may not cover all possible information. If you have questions about this medicine, talk to your doctor, pharmacist, or health care provider.  2015, [employer]. (2012-11-10 09:50:15)

## 2014-12-12 ENCOUNTER — Emergency Department (HOSPITAL_COMMUNITY)
Admission: EM | Admit: 2014-12-12 | Discharge: 2014-12-13 | Disposition: A | Discharge status: Home / Self Care | Payer: Self-pay | Attending: Emergency Medicine | Admitting: Emergency Medicine

## 2014-12-12 ENCOUNTER — Encounter (HOSPITAL_COMMUNITY): Payer: Self-pay | Admitting: Emergency Medicine

## 2014-12-12 DIAGNOSIS — S61431A Puncture wound without foreign body of right hand, initial encounter: Secondary | ICD-10-CM

## 2014-12-12 DIAGNOSIS — Y999 Unspecified external cause status: Secondary | ICD-10-CM | POA: Insufficient documentation

## 2014-12-12 DIAGNOSIS — Z792 Long term (current) use of antibiotics: Secondary | ICD-10-CM | POA: Insufficient documentation

## 2014-12-12 DIAGNOSIS — S61316A Laceration without foreign body of right little finger with damage to nail, initial encounter: Secondary | ICD-10-CM | POA: Insufficient documentation

## 2014-12-12 DIAGNOSIS — Z87891 Personal history of nicotine dependence: Secondary | ICD-10-CM | POA: Insufficient documentation

## 2014-12-12 DIAGNOSIS — Y92009 Unspecified place in unspecified non-institutional (private) residence as the place of occurrence of the external cause: Secondary | ICD-10-CM | POA: Insufficient documentation

## 2014-12-12 DIAGNOSIS — S81831A Puncture wound without foreign body, right lower leg, initial encounter: Secondary | ICD-10-CM

## 2014-12-12 DIAGNOSIS — W3400XA Accidental discharge from unspecified firearms or gun, initial encounter: Secondary | ICD-10-CM

## 2014-12-12 DIAGNOSIS — S81001A Unspecified open wound, right knee, initial encounter: Secondary | ICD-10-CM | POA: Insufficient documentation

## 2014-12-12 DIAGNOSIS — S61411A Laceration without foreign body of right hand, initial encounter: Secondary | ICD-10-CM | POA: Insufficient documentation

## 2014-12-12 DIAGNOSIS — Z86711 Personal history of pulmonary embolism: Secondary | ICD-10-CM | POA: Insufficient documentation

## 2014-12-12 DIAGNOSIS — Y9389 Activity, other specified: Secondary | ICD-10-CM | POA: Insufficient documentation

## 2014-12-12 DIAGNOSIS — S81801A Unspecified open wound, right lower leg, initial encounter: Secondary | ICD-10-CM | POA: Insufficient documentation

## 2014-12-12 MED ORDER — FENTANYL CITRATE (PF) 100 MCG/2ML IJ SOLN
INTRAMUSCULAR | Status: AC
  Administered 2014-12-13: 100 ug
  Filled 2014-12-12: qty 2
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
## 2014-12-13 ENCOUNTER — Emergency Department (HOSPITAL_COMMUNITY): Payer: Self-pay

## 2014-12-13 ENCOUNTER — Encounter (HOSPITAL_COMMUNITY): Payer: Self-pay

## 2014-12-13 LAB — TYPE AND SCREEN
ABO/RH(D): A POS
Antibody Screen: NEGATIVE

## 2014-12-13 LAB — CBC WITH DIFFERENTIAL/PLATELET
BASOS PCT: 0 % (ref 0–1)
Basophils Absolute: 0 10*3/uL (ref 0.0–0.1)
Eosinophils Absolute: 0.1 10*3/uL (ref 0.0–0.7)
Eosinophils Relative: 1 % (ref 0–5)
HEMATOCRIT: 40.2 % (ref 39.0–52.0)
Hemoglobin: 14.2 g/dL (ref 13.0–17.0)
Lymphocytes Relative: 21 % (ref 12–46)
Lymphs Abs: 2.2 10*3/uL (ref 0.7–4.0)
MCH: 30.9 pg (ref 26.0–34.0)
MCHC: 35.3 g/dL (ref 30.0–36.0)
MCV: 87.6 fL (ref 78.0–100.0)
MONOS PCT: 7 % (ref 3–12)
Monocytes Absolute: 0.7 10*3/uL (ref 0.1–1.0)
NEUTROS ABS: 7.2 10*3/uL (ref 1.7–7.7)
Neutrophils Relative %: 71 % (ref 43–77)
Platelets: 179 10*3/uL (ref 150–400)
RBC: 4.59 MIL/uL (ref 4.22–5.81)
RDW: 13.5 % (ref 11.5–15.5)
WBC: 10.1 10*3/uL (ref 4.0–10.5)

## 2014-12-13 LAB — ABO/RH: ABO/RH(D): A POS

## 2014-12-13 LAB — COMPREHENSIVE METABOLIC PANEL
ALT: 23 U/L (ref 17–63)
ANION GAP: 8 (ref 5–15)
AST: 23 U/L (ref 15–41)
Albumin: 3.5 g/dL (ref 3.5–5.0)
Alkaline Phosphatase: 56 U/L (ref 38–126)
BUN: 7 mg/dL (ref 6–20)
CO2: 24 mmol/L (ref 22–32)
Calcium: 8.7 mg/dL — ABNORMAL LOW (ref 8.9–10.3)
Chloride: 107 mmol/L (ref 101–111)
Creatinine, Ser: 0.99 mg/dL (ref 0.61–1.24)
GFR calc Af Amer: >60 mL/min (ref >60)
Glucose, Bld: 119 mg/dL — ABNORMAL HIGH (ref 65–99)
POTASSIUM: 3.5 mmol/L (ref 3.5–5.1)
Sodium: 139 mmol/L (ref 135–145)
Total Bilirubin: 0.5 mg/dL (ref 0.3–1.2)
Total Protein: 6.6 g/dL (ref 6.5–8.1)

## 2014-12-13 LAB — PROTIME-INR
INR: 1.01 (ref 0.00–1.49)
PROTHROMBIN TIME: 13.5 s (ref 11.6–15.2)

## 2014-12-13 LAB — APTT: APTT: 28 s (ref 24–37)

## 2014-12-13 MED ORDER — LORAZEPAM 2 MG/ML IJ SOLN
INTRAMUSCULAR | Status: AC
  Filled 2014-12-13: qty 1

## 2014-12-13 MED ORDER — OXYCODONE-ACETAMINOPHEN 5-325 MG PO TABS: 1.0000 | ORAL_TABLET | ORAL | Status: DC | PRN

## 2014-12-13 MED ORDER — HYDROMORPHONE HCL 1 MG/ML IJ SOLN
1.0000 mg | Freq: Once | INTRAMUSCULAR | Status: AC
  Administered 2014-12-13: 1 mg via INTRAVENOUS
  Filled 2014-12-13: qty 1

## 2014-12-13 MED ORDER — CEPHALEXIN 500 MG PO CAPS: 500.0000 mg | ORAL_CAPSULE | Freq: Four times a day (QID) | ORAL | Status: DC

## 2014-12-13 MED ORDER — CEFAZOLIN SODIUM 1-5 GM-% IV SOLN
1.0000 g | Freq: Once | INTRAVENOUS | Status: AC
  Administered 2014-12-13: 1 g via INTRAVENOUS
  Filled 2014-12-13: qty 50

## 2014-12-13 MED ORDER — FENTANYL CITRATE (PF) 100 MCG/2ML IJ SOLN
INTRAMUSCULAR | Status: AC
  Administered 2014-12-13: 100 ug
  Filled 2014-12-13: qty 2

## 2014-12-13 MED ORDER — LIDOCAINE HCL (PF) 1 % IJ SOLN
5.0000 mL | Freq: Once | INTRAMUSCULAR | Status: AC
  Administered 2014-12-13: 5 mL
  Filled 2014-12-13: qty 5

## 2014-12-13 MED ORDER — IOHEXOL 350 MG/ML SOLN
100.0000 mL | Freq: Once | INTRAVENOUS | Status: AC | PRN
Start: 2014-12-13 — End: 2014-12-13
  Administered 2014-12-13: 100 mL via INTRAVENOUS

## 2014-12-13 MED ORDER — ONDANSETRON 4 MG PO TBDP
ORAL_TABLET | ORAL | Status: AC
  Administered 2014-12-13: 8 mg
  Filled 2014-12-13: qty 2

## 2014-12-13 MED ORDER — OXYCODONE-ACETAMINOPHEN 5-325 MG PO TABS
2.0000 | ORAL_TABLET | Freq: Once | ORAL | Status: AC
  Administered 2014-12-13: 2 via ORAL
  Filled 2014-12-13: qty 2

## 2014-12-13 MED ORDER — ONDANSETRON HCL 4 MG PO TABS: 8.0000 mg | ORAL_TABLET | Freq: Once | ORAL | Status: AC

## 2014-12-13 MED ORDER — FENTANYL CITRATE (PF) 100 MCG/2ML IJ SOLN
100.0000 ug | Freq: Once | INTRAMUSCULAR | Status: DC
  Filled 2014-12-13: qty 2

## 2014-12-13 MED ORDER — TETANUS-DIPHTH-ACELL PERTUSSIS 5-2.5-18.5 LF-MCG/0.5 IM SUSP
INTRAMUSCULAR | Status: AC
  Filled 2014-12-13: qty 0.5

## 2014-12-13 MED ORDER — LIDOCAINE HCL (PF) 1 % IJ SOLN
INTRAMUSCULAR | Status: AC
  Filled 2014-12-13: qty 5

## 2014-12-13 MED ORDER — LORAZEPAM 2 MG/ML IJ SOLN
0.5000 mg | Freq: Once | INTRAMUSCULAR | Status: AC
  Administered 2014-12-13: 0.5 mg via INTRAVENOUS
  Filled 2014-12-13: qty 1
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
## 2014-12-13 NOTE — ED Notes (Signed)
Lab at bedside

## 2014-12-13 NOTE — ED Notes (Signed)
Ativan given at 0445

## 2014-12-13 NOTE — ED Notes (Signed)
Family at bedside. 
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
## 2014-12-18 ENCOUNTER — Encounter (HOSPITAL_COMMUNITY): Payer: Self-pay | Admitting: *Deleted

## 2014-12-18 ENCOUNTER — Inpatient Hospital Stay (HOSPITAL_COMMUNITY)
Admission: EM | Admit: 2014-12-18 | Discharge: 2014-12-20 | DRG: 603 | Disposition: A | Discharge status: Home / Self Care | Payer: Self-pay | Attending: Internal Medicine | Admitting: Internal Medicine

## 2014-12-18 DIAGNOSIS — M7989 Other specified soft tissue disorders: Secondary | ICD-10-CM | POA: Diagnosis present

## 2014-12-18 DIAGNOSIS — R609 Edema, unspecified: Secondary | ICD-10-CM

## 2014-12-18 DIAGNOSIS — L03115 Cellulitis of right lower limb: Principal | ICD-10-CM | POA: Diagnosis present

## 2014-12-18 DIAGNOSIS — Z8249 Family history of ischemic heart disease and other diseases of the circulatory system: Secondary | ICD-10-CM

## 2014-12-18 DIAGNOSIS — Z86711 Personal history of pulmonary embolism: Secondary | ICD-10-CM

## 2014-12-18 DIAGNOSIS — F17210 Nicotine dependence, cigarettes, uncomplicated: Secondary | ICD-10-CM | POA: Diagnosis present

## 2014-12-18 DIAGNOSIS — Z833 Family history of diabetes mellitus: Secondary | ICD-10-CM

## 2014-12-18 DIAGNOSIS — R1031 Right lower quadrant pain: Secondary | ICD-10-CM

## 2014-12-18 LAB — CBC WITH DIFFERENTIAL/PLATELET
BASOS PCT: 0 % (ref 0–1)
Basophils Absolute: 0 10*3/uL (ref 0.0–0.1)
EOS ABS: 0.1 10*3/uL (ref 0.0–0.7)
EOS PCT: 2 % (ref 0–5)
HCT: 42.3 % (ref 39.0–52.0)
Hemoglobin: 15.2 g/dL (ref 13.0–17.0)
LYMPHS ABS: 2.7 10*3/uL (ref 0.7–4.0)
Lymphocytes Relative: 37 % (ref 12–46)
MCH: 31.7 pg (ref 26.0–34.0)
MCHC: 35.9 g/dL (ref 30.0–36.0)
MCV: 88.1 fL (ref 78.0–100.0)
MONOS PCT: 9 % (ref 3–12)
Monocytes Absolute: 0.7 10*3/uL (ref 0.1–1.0)
NEUTROS PCT: 52 % (ref 43–77)
Neutro Abs: 4 10*3/uL (ref 1.7–7.7)
PLATELETS: 193 10*3/uL (ref 150–400)
RBC: 4.8 MIL/uL (ref 4.22–5.81)
RDW: 13.1 % (ref 11.5–15.5)
WBC: 7.5 10*3/uL (ref 4.0–10.5)

## 2014-12-18 LAB — I-STAT CG4 LACTIC ACID, ED: LACTIC ACID, VENOUS: 1.42 mmol/L (ref 0.5–2.0)

## 2014-12-18 LAB — COMPREHENSIVE METABOLIC PANEL
ALT: 23 U/L (ref 17–63)
AST: 23 U/L (ref 15–41)
Albumin: 4.2 g/dL (ref 3.5–5.0)
Alkaline Phosphatase: 63 U/L (ref 38–126)
Anion gap: 9 (ref 5–15)
BILIRUBIN TOTAL: 0.6 mg/dL (ref 0.3–1.2)
BUN: 13 mg/dL (ref 6–20)
CHLORIDE: 103 mmol/L (ref 101–111)
CO2: 24 mmol/L (ref 22–32)
CREATININE: 0.81 mg/dL (ref 0.61–1.24)
Calcium: 9.1 mg/dL (ref 8.9–10.3)
Glucose, Bld: 123 mg/dL — ABNORMAL HIGH (ref 65–99)
POTASSIUM: 4 mmol/L (ref 3.5–5.1)
Sodium: 136 mmol/L (ref 135–145)
TOTAL PROTEIN: 7.6 g/dL (ref 6.5–8.1)

## 2014-12-18 LAB — D-DIMER, QUANTITATIVE (NOT AT ARMC): D DIMER QUANT: 0.47 ug{FEU}/mL (ref 0.00–0.48)

## 2014-12-18 LAB — CK: Total CK: 319 U/L (ref 49–397)

## 2014-12-18 MED ORDER — SODIUM CHLORIDE 0.9 % IV BOLUS (SEPSIS)
1000.0000 mL | Freq: Once | INTRAVENOUS | Status: AC
  Administered 2014-12-18: 1000 mL via INTRAVENOUS

## 2014-12-18 MED ORDER — ONDANSETRON HCL 4 MG PO TABS: 4.0000 mg | ORAL_TABLET | Freq: Four times a day (QID) | ORAL | Status: DC | PRN

## 2014-12-18 MED ORDER — ENOXAPARIN SODIUM 40 MG/0.4ML ~~LOC~~ SOLN
40.0000 mg | SUBCUTANEOUS | Status: DC
  Administered 2014-12-19 (×2): 40 mg via SUBCUTANEOUS
  Filled 2014-12-18 (×2): qty 0.4

## 2014-12-18 MED ORDER — GI COCKTAIL ~~LOC~~: 30.0000 mL | Freq: Once | ORAL | Status: DC

## 2014-12-18 MED ORDER — SODIUM CHLORIDE 0.9 % IV SOLN
INTRAVENOUS | Status: DC
  Administered 2014-12-19: 75 mL/h via INTRAVENOUS
  Administered 2014-12-19: 01:00:00 via INTRAVENOUS

## 2014-12-18 MED ORDER — SODIUM CHLORIDE 0.9 % IV BOLUS (SEPSIS): 1000.0000 mL | Freq: Once | INTRAVENOUS | Status: DC

## 2014-12-18 MED ORDER — ONDANSETRON HCL 4 MG/2ML IJ SOLN: 4.0000 mg | Freq: Once | INTRAMUSCULAR | Status: DC

## 2014-12-18 MED ORDER — VANCOMYCIN HCL 10 G IV SOLR
1500.0000 mg | Freq: Once | INTRAVENOUS | Status: AC
  Administered 2014-12-19: 1500 mg via INTRAVENOUS
  Filled 2014-12-18: qty 1500

## 2014-12-18 MED ORDER — IBUPROFEN 600 MG PO TABS: 600.0000 mg | ORAL_TABLET | Freq: Four times a day (QID) | ORAL | Status: DC | PRN

## 2014-12-18 MED ORDER — ONDANSETRON HCL 4 MG/2ML IJ SOLN: 4.0000 mg | Freq: Four times a day (QID) | INTRAMUSCULAR | Status: DC | PRN

## 2014-12-18 MED ORDER — MORPHINE SULFATE (PF) 4 MG/ML IV SOLN
4.0000 mg | Freq: Once | INTRAVENOUS | Status: AC
  Administered 2014-12-18: 4 mg via INTRAVENOUS
  Filled 2014-12-18: qty 1

## 2014-12-18 MED ORDER — VANCOMYCIN HCL IN DEXTROSE 1-5 GM/200ML-% IV SOLN
1000.0000 mg | Freq: Once | INTRAVENOUS | Status: AC
  Administered 2014-12-18: 1000 mg via INTRAVENOUS
  Filled 2014-12-18: qty 200

## 2014-12-18 MED ORDER — HYDROCODONE-ACETAMINOPHEN 5-325 MG PO TABS
1.0000 | ORAL_TABLET | ORAL | Status: DC | PRN
  Administered 2014-12-19 (×2): 2 via ORAL
  Filled 2014-12-18 (×2): qty 2

## 2014-12-18 MED ORDER — ONDANSETRON HCL 4 MG/2ML IJ SOLN
4.0000 mg | Freq: Once | INTRAMUSCULAR | Status: AC
  Administered 2014-12-18: 4 mg via INTRAVENOUS
  Filled 2014-12-18: qty 2

## 2014-12-18 MED ORDER — LORAZEPAM 0.5 MG PO TABS
0.5000 mg | ORAL_TABLET | Freq: Once | ORAL | Status: AC
  Administered 2014-12-18: 0.5 mg via ORAL
  Filled 2014-12-18: qty 1
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
## 2014-12-18 NOTE — ED Notes (Signed)
Report given to [facility] RN on 300
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
## 2014-12-18 NOTE — ED Notes (Signed)
Patient on cardiac monitor at this time. EKG done and given to Dr Corlis Leak.
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
## 2014-12-19 ENCOUNTER — Encounter (HOSPITAL_COMMUNITY): Payer: Self-pay

## 2014-12-19 ENCOUNTER — Observation Stay (HOSPITAL_COMMUNITY): Payer: Self-pay

## 2014-12-19 DIAGNOSIS — L03115 Cellulitis of right lower limb: Principal | ICD-10-CM

## 2014-12-19 DIAGNOSIS — M7989 Other specified soft tissue disorders: Secondary | ICD-10-CM

## 2014-12-19 LAB — COMPREHENSIVE METABOLIC PANEL
ALT: 18 U/L (ref 17–63)
ANION GAP: 7 (ref 5–15)
AST: 20 U/L (ref 15–41)
Albumin: 3.5 g/dL (ref 3.5–5.0)
Alkaline Phosphatase: 52 U/L (ref 38–126)
BILIRUBIN TOTAL: 0.8 mg/dL (ref 0.3–1.2)
BUN: 11 mg/dL (ref 6–20)
CO2: 24 mmol/L (ref 22–32)
Calcium: 8.6 mg/dL — ABNORMAL LOW (ref 8.9–10.3)
Chloride: 107 mmol/L (ref 101–111)
Creatinine, Ser: 0.77 mg/dL (ref 0.61–1.24)
GFR calc Af Amer: >60 mL/min (ref >60)
Glucose, Bld: 101 mg/dL — ABNORMAL HIGH (ref 65–99)
POTASSIUM: 4.1 mmol/L (ref 3.5–5.1)
Sodium: 138 mmol/L (ref 135–145)
TOTAL PROTEIN: 6.5 g/dL (ref 6.5–8.1)

## 2014-12-19 LAB — CBC
HEMATOCRIT: 40.4 % (ref 39.0–52.0)
Hemoglobin: 14.3 g/dL (ref 13.0–17.0)
MCH: 31.5 pg (ref 26.0–34.0)
MCHC: 35.4 g/dL (ref 30.0–36.0)
MCV: 89 fL (ref 78.0–100.0)
Platelets: 162 10*3/uL (ref 150–400)
RBC: 4.54 MIL/uL (ref 4.22–5.81)
RDW: 13.2 % (ref 11.5–15.5)
WBC: 7.1 10*3/uL (ref 4.0–10.5)

## 2014-12-19 MED ORDER — ALPRAZOLAM 0.5 MG PO TABS
0.5000 mg | ORAL_TABLET | Freq: Every evening | ORAL | Status: DC | PRN
  Administered 2014-12-19 – 2014-12-20 (×2): 0.5 mg via ORAL
  Filled 2014-12-19 (×2): qty 1

## 2014-12-19 MED ORDER — VANCOMYCIN HCL IN DEXTROSE 1-5 GM/200ML-% IV SOLN
1000.0000 mg | Freq: Three times a day (TID) | INTRAVENOUS | Status: DC
  Administered 2014-12-19 – 2014-12-20 (×4): 1000 mg via INTRAVENOUS
  Filled 2014-12-19 (×7): qty 200
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
## 2014-12-20 MED ORDER — SULFAMETHOXAZOLE-TRIMETHOPRIM 800-160 MG PO TABS: 1.0000 | ORAL_TABLET | Freq: Two times a day (BID) | ORAL | Status: DC

## 2014-12-20 NOTE — Progress Notes (Signed)
Pt IV removed, tolerated well. Reviewed discharge instructions with pt and answered questions.   

## 2014-12-20 NOTE — Care Management Note (Signed)
Case Management Note  Patient Details  Name: TAELYN NEMES MRN: [id number] Date of Birth: 06/08/1985  Subjective/Objective:                    Action/Plan:   Expected Discharge Date:                  Expected Discharge Plan:  Home/Self Care  In-House Referral:  Financial Counselor  Discharge planning Services  CM Consult, Follow-up appt scheduled  Post Acute Care Choice:  NA Choice offered to:  NA  DME Arranged:    DME Agency:     HH Arranged:  RN HH Agency:  [facility]  Status of Service:  Completed, signed off  Medicare Important Message Given:    Date Medicare IM Given:    Medicare IM give by:    Date Additional Medicare IM Given:    Additional Medicare Important Message give by:     If discussed at Long Length of Stay Meetings, dates discussed:    Additional Comments: Pt discharged home today with [facility] RN for wound care management (pt has no insurance). Alroy Bailiff of [facility] is aware and will collect the pts information from the chart. HH services to start within 48 hours of discharge. Pt also has follow up appt with [facility] and appt time is documented on AVS. Pt also made aware of appt time. Pt given paperwork from [facility] to complete and take to appt time. No MATCH given as pts discharge meds on $4 list at [facility]. Pt and pts nurse aware of discharge arrangements. Arlyss Queen [place], RN 12/20/2014, 11:01 AM

## 2014-12-21 MED ORDER — SULFAMETHOXAZOLE-TRIMETHOPRIM 800-160 MG PO TABS: 1.0000 | ORAL_TABLET | Freq: Two times a day (BID) | ORAL | Status: DC

## 2014-12-21 MED FILL — Oxycodone w/ Acetaminophen Tab 5-325 MG: ORAL | Qty: 6 | Status: AC

## 2015-10-16 ENCOUNTER — Emergency Department (HOSPITAL_COMMUNITY)
Admission: EM | Admit: 2015-10-16 | Discharge: 2015-10-16 | Disposition: A | Discharge status: Home / Self Care | Payer: Self-pay | Attending: Emergency Medicine | Admitting: Emergency Medicine

## 2015-10-16 ENCOUNTER — Emergency Department (HOSPITAL_COMMUNITY): Payer: Self-pay

## 2015-10-16 ENCOUNTER — Encounter (HOSPITAL_COMMUNITY): Payer: Self-pay | Admitting: Emergency Medicine

## 2015-10-16 DIAGNOSIS — Z791 Long term (current) use of non-steroidal anti-inflammatories (NSAID): Secondary | ICD-10-CM | POA: Insufficient documentation

## 2015-10-16 DIAGNOSIS — R519 Headache, unspecified: Secondary | ICD-10-CM

## 2015-10-16 DIAGNOSIS — K029 Dental caries, unspecified: Secondary | ICD-10-CM | POA: Insufficient documentation

## 2015-10-16 DIAGNOSIS — R51 Headache: Secondary | ICD-10-CM | POA: Insufficient documentation

## 2015-10-16 DIAGNOSIS — Z79899 Other long term (current) drug therapy: Secondary | ICD-10-CM | POA: Insufficient documentation

## 2015-10-16 DIAGNOSIS — K0889 Other specified disorders of teeth and supporting structures: Secondary | ICD-10-CM

## 2015-10-16 HISTORY — DX: Accidental discharge from unspecified firearms or gun, initial encounter: W34.00XA

## 2015-10-16 LAB — BASIC METABOLIC PANEL
ANION GAP: 8 (ref 5–15)
BUN: 7 mg/dL (ref 6–20)
CO2: 29 mmol/L (ref 22–32)
Calcium: 9.4 mg/dL (ref 8.9–10.3)
Chloride: 102 mmol/L (ref 101–111)
Creatinine, Ser: 0.84 mg/dL (ref 0.61–1.24)
GFR calc Af Amer: >60 mL/min (ref >60)
GLUCOSE: 135 mg/dL — AB (ref 65–99)
POTASSIUM: 3.2 mmol/L — AB (ref 3.5–5.1)
SODIUM: 139 mmol/L (ref 135–145)

## 2015-10-16 LAB — CBC WITH DIFFERENTIAL/PLATELET
BASOS PCT: 0 %
Basophils Absolute: 0 10*3/uL (ref 0.0–0.1)
Eosinophils Absolute: 0.2 10*3/uL (ref 0.0–0.7)
Eosinophils Relative: 3 %
HCT: 47.1 % (ref 39.0–52.0)
Hemoglobin: 16.5 g/dL (ref 13.0–17.0)
LYMPHS PCT: 30 %
Lymphs Abs: 2.1 10*3/uL (ref 0.7–4.0)
MCH: 30.9 pg (ref 26.0–34.0)
MCHC: 35 g/dL (ref 30.0–36.0)
MCV: 88.2 fL (ref 78.0–100.0)
Monocytes Absolute: 0.7 10*3/uL (ref 0.1–1.0)
Monocytes Relative: 9 %
NEUTROS ABS: 4.1 10*3/uL (ref 1.7–7.7)
Neutrophils Relative %: 58 %
PLATELETS: 167 10*3/uL (ref 150–400)
RBC: 5.34 MIL/uL (ref 4.22–5.81)
RDW: 13.1 % (ref 11.5–15.5)
WBC: 7.1 10*3/uL (ref 4.0–10.5)

## 2015-10-16 MED ORDER — CLINDAMYCIN HCL 150 MG PO CAPS
300.0000 mg | ORAL_CAPSULE | Freq: Once | ORAL | Status: AC
  Administered 2015-10-16: 300 mg via ORAL
  Filled 2015-10-16: qty 2

## 2015-10-16 MED ORDER — CLINDAMYCIN HCL 150 MG PO CAPS: 150.0000 mg | ORAL_CAPSULE | Freq: Four times a day (QID) | ORAL | Status: DC

## 2015-10-16 MED ORDER — DICLOFENAC SODIUM 75 MG PO TBEC: 75.0000 mg | DELAYED_RELEASE_TABLET | Freq: Two times a day (BID) | ORAL | Status: DC

## 2015-10-16 MED ORDER — DIPHENHYDRAMINE HCL 50 MG/ML IJ SOLN
25.0000 mg | Freq: Once | INTRAMUSCULAR | Status: AC
  Administered 2015-10-16: 25 mg via INTRAVENOUS
  Filled 2015-10-16: qty 1

## 2015-10-16 MED ORDER — KETOROLAC TROMETHAMINE 30 MG/ML IJ SOLN
30.0000 mg | Freq: Once | INTRAMUSCULAR | Status: AC
  Administered 2015-10-16: 30 mg via INTRAVENOUS
  Filled 2015-10-16: qty 1

## 2015-10-16 MED ORDER — ONDANSETRON HCL 4 MG/2ML IJ SOLN
4.0000 mg | Freq: Once | INTRAMUSCULAR | Status: AC
  Administered 2015-10-16: 4 mg via INTRAVENOUS
  Filled 2015-10-16: qty 2

## 2015-10-16 MED ORDER — MORPHINE SULFATE (PF) 4 MG/ML IV SOLN
4.0000 mg | Freq: Once | INTRAVENOUS | Status: AC
  Administered 2015-10-16: 4 mg via INTRAVENOUS
  Filled 2015-10-16: qty 1

## 2015-10-16 NOTE — Discharge Instructions (Signed)
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
## 2015-10-16 NOTE — ED Notes (Addendum)
Patient complaining of headache since yesterday. Denies n/v. States "it started out as my tooth hurting but now it doesn't feel like my tooth, it just hurts in my head. "Patient states he was on blood thinners for a PE but quit taking them 1 1/2 - 2 years ago. States his doctor did not want him to stop taking them but he did anyway.

## 2015-10-16 NOTE — ED Notes (Signed)
Pt given dental resource packet.

## 2020-10-04 DIAGNOSIS — Z0189 Encounter for other specified special examinations: Secondary | ICD-10-CM | POA: Diagnosis not present

## 2020-10-04 DIAGNOSIS — J302 Other seasonal allergic rhinitis: Secondary | ICD-10-CM | POA: Diagnosis not present

## 2020-10-04 DIAGNOSIS — Z8616 Personal history of COVID-19: Secondary | ICD-10-CM | POA: Diagnosis not present

## 2020-10-04 DIAGNOSIS — Z86711 Personal history of pulmonary embolism: Secondary | ICD-10-CM | POA: Diagnosis not present

## 2020-10-16 DIAGNOSIS — Z0000 Encounter for general adult medical examination without abnormal findings: Secondary | ICD-10-CM | POA: Diagnosis not present

## 2020-10-18 DIAGNOSIS — Z86711 Personal history of pulmonary embolism: Secondary | ICD-10-CM | POA: Diagnosis not present

## 2020-10-18 DIAGNOSIS — J302 Other seasonal allergic rhinitis: Secondary | ICD-10-CM | POA: Diagnosis not present

## 2020-10-18 DIAGNOSIS — Z8616 Personal history of COVID-19: Secondary | ICD-10-CM | POA: Diagnosis not present

## 2021-07-12 ENCOUNTER — Other Ambulatory Visit: Payer: Self-pay

## 2021-07-12 DIAGNOSIS — Z8249 Family history of ischemic heart disease and other diseases of the circulatory system: Secondary | ICD-10-CM

## 2021-07-12 DIAGNOSIS — I2699 Other pulmonary embolism without acute cor pulmonale: Principal | ICD-10-CM | POA: Diagnosis present

## 2021-07-12 DIAGNOSIS — Z86711 Personal history of pulmonary embolism: Secondary | ICD-10-CM

## 2021-07-12 DIAGNOSIS — Z91041 Radiographic dye allergy status: Secondary | ICD-10-CM

## 2021-07-12 DIAGNOSIS — I82431 Acute embolism and thrombosis of right popliteal vein: Secondary | ICD-10-CM | POA: Diagnosis present

## 2021-07-12 DIAGNOSIS — Z833 Family history of diabetes mellitus: Secondary | ICD-10-CM

## 2021-07-12 DIAGNOSIS — E669 Obesity, unspecified: Secondary | ICD-10-CM | POA: Diagnosis present

## 2021-07-12 DIAGNOSIS — Z6837 Body mass index (BMI) 37.0-37.9, adult: Secondary | ICD-10-CM

## 2021-07-13 ENCOUNTER — Inpatient Hospital Stay (HOSPITAL_COMMUNITY): Payer: Self-pay

## 2021-07-13 ENCOUNTER — Inpatient Hospital Stay (HOSPITAL_COMMUNITY)
Admission: EM | Admit: 2021-07-13 | Discharge: 2021-07-16 | DRG: 176 | Disposition: A | Discharge status: Home / Self Care | Payer: Self-pay | Attending: Internal Medicine | Admitting: Internal Medicine

## 2021-07-13 ENCOUNTER — Encounter (HOSPITAL_COMMUNITY): Payer: Self-pay | Admitting: Emergency Medicine

## 2021-07-13 ENCOUNTER — Emergency Department (HOSPITAL_COMMUNITY): Payer: Self-pay

## 2021-07-13 DIAGNOSIS — E669 Obesity, unspecified: Secondary | ICD-10-CM | POA: Diagnosis present

## 2021-07-13 DIAGNOSIS — I2699 Other pulmonary embolism without acute cor pulmonale: Secondary | ICD-10-CM | POA: Diagnosis present

## 2021-07-13 DIAGNOSIS — I2609 Other pulmonary embolism with acute cor pulmonale: Secondary | ICD-10-CM

## 2021-07-13 LAB — COMPREHENSIVE METABOLIC PANEL
ALT: 36 U/L (ref 0–44)
AST: 30 U/L (ref 15–41)
Albumin: 4 g/dL (ref 3.5–5.0)
Alkaline Phosphatase: 54 U/L (ref 38–126)
Anion gap: 11 (ref 5–15)
BUN: 13 mg/dL (ref 6–20)
CO2: 22 mmol/L (ref 22–32)
Calcium: 9 mg/dL (ref 8.9–10.3)
Chloride: 104 mmol/L (ref 98–111)
Creatinine, Ser: 1.14 mg/dL (ref 0.61–1.24)
GFR, Estimated: >60 mL/min (ref >60)
Glucose, Bld: 165 mg/dL — ABNORMAL HIGH (ref 70–99)
Potassium: 3.5 mmol/L (ref 3.5–5.1)
Sodium: 137 mmol/L (ref 135–145)
Total Bilirubin: 0.9 mg/dL (ref 0.3–1.2)
Total Protein: 7.6 g/dL (ref 6.5–8.1)

## 2021-07-13 LAB — CBC WITH DIFFERENTIAL/PLATELET
Abs Immature Granulocytes: 0.03 10*3/uL (ref 0.00–0.07)
Basophils Absolute: 0 10*3/uL (ref 0.0–0.1)
Basophils Relative: 0 %
Eosinophils Absolute: 0.1 10*3/uL (ref 0.0–0.5)
Eosinophils Relative: 1 %
HCT: 42.3 % (ref 39.0–52.0)
Hemoglobin: 14.5 g/dL (ref 13.0–17.0)
Immature Granulocytes: 0 %
Lymphocytes Relative: 31 %
Lymphs Abs: 2.7 10*3/uL (ref 0.7–4.0)
MCH: 28.8 pg (ref 26.0–34.0)
MCHC: 34.3 g/dL (ref 30.0–36.0)
MCV: 83.9 fL (ref 80.0–100.0)
Monocytes Absolute: 0.5 10*3/uL (ref 0.1–1.0)
Monocytes Relative: 6 %
Neutro Abs: 5.2 10*3/uL (ref 1.7–7.7)
Neutrophils Relative %: 62 %
Platelets: 145 10*3/uL — ABNORMAL LOW (ref 150–400)
RBC: 5.04 MIL/uL (ref 4.22–5.81)
RDW: 13.1 % (ref 11.5–15.5)
WBC: 8.6 10*3/uL (ref 4.0–10.5)
nRBC: 0 % (ref 0.0–0.2)

## 2021-07-13 LAB — BRAIN NATRIURETIC PEPTIDE: B Natriuretic Peptide: 162 pg/mL — ABNORMAL HIGH (ref 0.0–100.0)

## 2021-07-13 LAB — HEPARIN LEVEL (UNFRACTIONATED)
Heparin Unfractionated: 0.45 IU/mL (ref 0.30–0.70)
Heparin Unfractionated: 0.31 IU/mL (ref 0.30–0.70)

## 2021-07-13 LAB — D-DIMER, QUANTITATIVE: D-Dimer, Quant: 8.04 ug/mL-FEU — ABNORMAL HIGH (ref 0.00–0.50)

## 2021-07-13 LAB — TROPONIN I (HIGH SENSITIVITY): Troponin I (High Sensitivity): 71 ng/L — ABNORMAL HIGH (ref <18)

## 2021-07-13 LAB — HIV ANTIBODY (ROUTINE TESTING W REFLEX): HIV Screen 4th Generation wRfx: NONREACTIVE

## 2021-07-13 MED ORDER — ONDANSETRON HCL 4 MG PO TABS: 4.0000 mg | ORAL_TABLET | Freq: Four times a day (QID) | ORAL | Status: DC | PRN

## 2021-07-13 MED ORDER — HEPARIN (PORCINE) 25000 UT/250ML-% IV SOLN
2100.0000 [IU]/h | INTRAVENOUS | Status: DC
  Administered 2021-07-13 (×2): 1800 [IU]/h via INTRAVENOUS
  Administered 2021-07-14 (×2): 1950 [IU]/h via INTRAVENOUS
  Administered 2021-07-15: 2000 [IU]/h via INTRAVENOUS
  Administered 2021-07-15: 1950 [IU]/h via INTRAVENOUS
  Filled 2021-07-13 (×5): qty 250

## 2021-07-13 MED ORDER — ACETAMINOPHEN 650 MG RE SUPP: 650.0000 mg | Freq: Four times a day (QID) | RECTAL | Status: DC | PRN

## 2021-07-13 MED ORDER — HYDROCORTISONE SOD SUC (PF) 100 MG IJ SOLR
200.0000 mg | Freq: Once | INTRAMUSCULAR | Status: AC
  Administered 2021-07-13: 200 mg via INTRAVENOUS
  Filled 2021-07-13: qty 4

## 2021-07-13 MED ORDER — ONDANSETRON HCL 4 MG/2ML IJ SOLN: 4.0000 mg | Freq: Four times a day (QID) | INTRAMUSCULAR | Status: DC | PRN

## 2021-07-13 MED ORDER — ACETAMINOPHEN 325 MG PO TABS
650.0000 mg | ORAL_TABLET | Freq: Four times a day (QID) | ORAL | Status: DC | PRN
Start: 2021-07-13 — End: 2021-07-16

## 2021-07-13 MED ORDER — DIPHENHYDRAMINE HCL 50 MG/ML IJ SOLN: 50.0000 mg | Freq: Once | INTRAMUSCULAR | Status: DC

## 2021-07-13 MED ORDER — HEPARIN BOLUS VIA INFUSION
7000.0000 [IU] | Freq: Once | INTRAVENOUS | Status: AC
  Administered 2021-07-13: 7000 [IU] via INTRAVENOUS

## 2021-07-13 MED ORDER — PROCHLORPERAZINE EDISYLATE 10 MG/2ML IJ SOLN: 10.0000 mg | Freq: Once | INTRAMUSCULAR | Status: DC

## 2021-07-13 MED ORDER — IOHEXOL 350 MG/ML SOLN
100.0000 mL | Freq: Once | INTRAVENOUS | Status: AC | PRN
  Administered 2021-07-13: 100 mL via INTRAVENOUS

## 2021-07-13 MED ORDER — CHLORHEXIDINE GLUCONATE CLOTH 2 % EX PADS
6.0000 | MEDICATED_PAD | Freq: Every day | CUTANEOUS | Status: DC
  Administered 2021-07-14 – 2021-07-16 (×3): 6 via TOPICAL

## 2021-07-13 MED ORDER — HEPARIN (PORCINE) 25000 UT/250ML-% IV SOLN
1600.0000 [IU]/h | INTRAVENOUS | Status: DC
  Administered 2021-07-13: 1600 [IU]/h via INTRAVENOUS
  Filled 2021-07-13: qty 250

## 2021-07-13 MED ORDER — ONDANSETRON 8 MG PO TBDP
8.0000 mg | ORAL_TABLET | Freq: Once | ORAL | Status: AC
  Administered 2021-07-13: 8 mg via ORAL
  Filled 2021-07-13: qty 1

## 2021-07-13 MED ORDER — DIPHENHYDRAMINE HCL 25 MG PO CAPS: 50.0000 mg | ORAL_CAPSULE | Freq: Once | ORAL | Status: DC

## 2021-07-13 MED ORDER — ALBUTEROL SULFATE HFA 108 (90 BASE) MCG/ACT IN AERS
2.0000 | INHALATION_SPRAY | RESPIRATORY_TRACT | Status: DC | PRN
  Administered 2021-07-13: 2 via RESPIRATORY_TRACT
  Filled 2021-07-13: qty 6.7

## 2021-07-13 NOTE — ED Notes (Signed)
Attempted to give report 

## 2021-07-13 NOTE — ED Notes (Signed)
Patient transported to CT 
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
## 2021-07-13 NOTE — ED Notes (Signed)
Patient transported to echo ?
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
## 2021-07-13 NOTE — ED Provider Notes (Signed)
?[facility] EMERGENCY DEPARTMENT ?Provider Note ? ? ?CSN: [id number] ?Arrival date & time: 07/12/21  2349 ? ?  ? ?History ? ?Chief Complaint  ?Patient presents with  ? Shortness of Breath  ? ? ?Cristian Flores is a 36 y.o. male. ? ?36 year old male with a past medical history of pulmonary embolus 10 years ago without a clear cause not currently on anticoagulation the presents to the emergency today secondary to progressively worsening chest pain shortness of breath with exertion.  Better with rest.  Had some left calf pain about a week ago prior to all this starting but is gone now.  He currently has a job as a Naval architect.  No fevers or cough.  Feels similar to when he had a PE in the past ? ? ?Shortness of Breath ?Severity:  Moderate ?Onset quality:  Gradual ?Duration:  1 week ?Timing:  Constant ?Progression:  Worsening ?Chronicity:  New ?Relieved by:  Rest ? ?  ? ?Home Medications ?Prior to Admission medications   ?Medication Sig Start Date End Date Taking? Authorizing Provider  ?acetaminophen (TYLENOL) 325 MG tablet Take 975 mg by mouth every 6 (six) hours as needed for mild pain.    [provider]  ?clindamycin (CLEOCIN) 150 MG capsule Take 1 capsule (150 mg total) by mouth every 6 (six) hours. 10/16/15   Triplett, Tammy, PA-C  ?diclofenac (VOLTAREN) 75 MG EC tablet Take 1 tablet (75 mg total) by mouth 2 (two) times daily. Take with food 10/16/15   Triplett, Tammy, PA-C  ?ibuprofen (ADVIL,MOTRIN) 200 MG tablet Take 600 mg by mouth every 6 (six) hours as needed for mild pain.    [provider]  ?   ? ?Allergies    ?Contrast media [iodinated contrast media]   ? ?Review of Systems   ?Review of Systems  ?Respiratory:  Positive for shortness of breath.   ? ?Physical Exam ?Updated Vital Signs ?BP 106/66   Pulse 95   Temp 98 ?F (36.7 ?C) (Oral)   Resp (!) 21   Ht 5\' 11"  (1.803 m)   Wt 122.5 kg   SpO2 94%   BMI 37.66 kg/m?  ?Physical Exam ?Vitals and nursing note reviewed.  ?Constitutional:   ?    Appearance: He is well-developed.  ?HENT:  ?   Head: Normocephalic and atraumatic.  ?   Mouth/Throat:  ?   Mouth: Mucous membranes are moist.  ?   Pharynx: Oropharynx is clear.  ?Eyes:  ?   Pupils: Pupils are equal, round, and reactive to light.  ?Cardiovascular:  ?   Rate and Rhythm: Tachycardia present.  ?Pulmonary:  ?   Effort: Pulmonary effort is normal. Tachypnea present. No respiratory distress.  ?Abdominal:  ?   General: There is no distension.  ?Musculoskeletal:     ?   General: Normal range of motion.  ?   Cervical back: Normal range of motion.  ?   Right lower leg: No tenderness. No edema.  ?   Left lower leg: No tenderness. No edema.  ?Skin: ?   General: Skin is warm and dry.  ?Neurological:  ?   General: No focal deficit present.  ?   Mental Status: He is alert.  ? ? ?ED Results / Procedures / Treatments   ?Labs ?(all labs ordered are listed, but only abnormal results are displayed) ?Labs Reviewed  ?D-DIMER, QUANTITATIVE - Abnormal; Notable for the following components:  ?    Result Value  ? D-Dimer, Quant 8.04 (*)   ?  All other components within normal limits  ?CBC WITH DIFFERENTIAL/PLATELET - Abnormal; Notable for the following components:  ? Platelets 145 (*)   ? All other components within normal limits  ?COMPREHENSIVE METABOLIC PANEL - Abnormal; Notable for the following components:  ? Glucose, Bld 165 (*)   ? All other components within normal limits  ?BRAIN NATRIURETIC PEPTIDE - Abnormal; Notable for the following components:  ? B Natriuretic Peptide 162.0 (*)   ? All other components within normal limits  ?TROPONIN I (HIGH SENSITIVITY) - Abnormal; Notable for the following components:  ? Troponin I (High Sensitivity) 71 (*)   ? All other components within normal limits  ?HEPARIN LEVEL (UNFRACTIONATED)  ? ? ?EKG ?EKG Interpretation ? ?Date/Time:  Friday July 13 2021 00:05:57 EDT ?Ventricular Rate:  111 ?PR Interval:  136 ?QRS Duration: 100 ?QT Interval:  381 ?QTC Calculation: 518 ?R  Axis:   132 ?Text Interpretation: Sinus tachycardia Right axis deviation Abnormal T, consider ischemia, anterior leads Prolonged QT interval Confirmed by Marily Memos [phone]) on 07/13/2021 3:34:03 AM ? ?Radiology ?CT Angio Chest PE W and/or Wo Contrast ? ?Result Date: 07/13/2021 ?CLINICAL DATA:  Pulmonary embolism suspected.  History of PE. EXAM: CT ANGIOGRAPHY CHEST WITH CONTRAST TECHNIQUE: Multidetector CT imaging of the chest was performed using the standard protocol during bolus administration of intravenous contrast. Multiplanar CT image reconstructions and MIPs were obtained to evaluate the vascular anatomy. RADIATION DOSE REDUCTION: This exam was performed according to the departmental dose-optimization program which includes automated exposure control, adjustment of the mA and/or kV according to patient size and/or use of iterative reconstruction technique. CONTRAST:  OMNIPAQUE IOHEXOL 350 MG/ML SOLN COMPARISON:  09/08/2012 FINDINGS: Cardiovascular: Large volume of acute branching pulmonary emboli seen affecting all lobes with diffuse segmental involvement and multiple occlusive acute appearing clots. Bilateral lobar clot is present. There is dilatation of the right heart with RV to LV ratio of 1.57. No pericardial effusion. Negative aorta. Critical Value/emergent results were called by telephone at the time of interpretation on 07/13/2021 at 4:55 am to provider [facility] , who verbally acknowledged these results. Mediastinum/Nodes: Negative for adenopathy or mass Lungs/Pleura: Dependent atelectatic type opacity. There is no no edema or infarct. Upper Abdomen: Negative Musculoskeletal: Negative Review of the MIP images confirms the above findings. IMPRESSION: Positive for acute pulmonary embolism with CT evidence of right heart strain (RV/LV Ratio = 1.57) consistent with at least submassive (intermediate risk) PE. The presence of right heart strain has been associated with an increased risk of  morbidity and mortality. Please refer to the "PE Focused" order set in EPIC. Electronically Signed   By: Tiburcio Pea M.D.   On: 07/13/2021 04:54  ? ?DG Chest Portable 1 View ? ?Result Date: 07/13/2021 ?CLINICAL DATA:  Dyspnea, chest pain EXAM: PORTABLE CHEST 1 VIEW COMPARISON:  None. FINDINGS: The heart size and mediastinal contours are within normal limits. Both lungs are clear. The visualized skeletal structures are unremarkable. IMPRESSION: No active disease. Electronically Signed   By: Helyn Numbers M.D.   On: 07/13/2021 00:28   ? ?Procedures ?Marland KitchenCritical Care ?Performed by: Marily Memos, MD ?Authorized by: Marily Memos, MD  ? ?Critical care provider statement:  ?  Critical care time (minutes):  32 ?  Critical care time was exclusive of:  Separately billable procedures and treating other patients and teaching time ?  Critical care was necessary to treat or prevent imminent or life-threatening deterioration of the following conditions:  Respiratory failure and circulatory failure ?  Critical care was time spent personally by me on the following activities:  Development of treatment plan with patient or surrogate, evaluation of patient's response to treatment, examination of patient, obtaining history from patient or surrogate, review of old charts, re-evaluation of patient's condition, pulse oximetry, ordering and performing treatments and interventions, ordering and review of laboratory studies, discussions with consultants and ordering and review of radiographic studies ?  I assumed direction of critical care for this patient from another provider in my specialty: no   ?  Care discussed with: admitting provider    ? ? ?Medications Ordered in ED ?Medications  ?albuterol (VENTOLIN HFA) 108 (90 Base) MCG/ACT inhaler 2 puff (2 puffs Inhalation Given 07/13/21 0036)  ?diphenhydrAMINE (BENADRYL) capsule 50 mg (50 mg Oral Not Given 07/13/21 [id number])  ?  Or  ?diphenhydrAMINE (BENADRYL) injection 50 mg ( Intravenous See  Alternative 07/13/21 [id number])  ?heparin ADULT infusion 100 units/mL (25000 units/27250mL) (1,600 Units/hr Intravenous New Bag/Given 07/13/21 0405)  ?hydrocortisone sodium succinate (SOLU-CORTEF) 100 MG injection 200 mg (200 mg Intraveno
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
## 2021-07-13 NOTE — Plan of Care (Signed)
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
## 2021-07-13 NOTE — Assessment & Plan Note (Addendum)
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

## 2021-07-13 NOTE — Assessment & Plan Note (Signed)
Lifestyle changes outpatient °

## 2021-07-13 NOTE — ED Triage Notes (Addendum)
Pt with c/o sob and chest pain that started Saturday. Pt has hx of PE and was concerned about this.  ?

## 2021-07-14 ENCOUNTER — Inpatient Hospital Stay (HOSPITAL_COMMUNITY): Payer: Self-pay

## 2021-07-14 DIAGNOSIS — I2609 Other pulmonary embolism with acute cor pulmonale: Secondary | ICD-10-CM

## 2021-07-14 LAB — ECHOCARDIOGRAM COMPLETE
Area-P 1/2: 2 cm2
Calc EF: 46.7 %
Height: 71 in
S' Lateral: 4.2 cm
Single Plane A2C EF: 53.1 %
Single Plane A4C EF: 41.5 %
Weight: 4320 oz

## 2021-07-14 LAB — BASIC METABOLIC PANEL
Anion gap: 8 (ref 5–15)
BUN: 15 mg/dL (ref 6–20)
CO2: 25 mmol/L (ref 22–32)
Calcium: 8.5 mg/dL — ABNORMAL LOW (ref 8.9–10.3)
Chloride: 107 mmol/L (ref 98–111)
Creatinine, Ser: 1.14 mg/dL (ref 0.61–1.24)
GFR, Estimated: >60 mL/min (ref >60)
Glucose, Bld: 152 mg/dL — ABNORMAL HIGH (ref 70–99)
Potassium: 3.4 mmol/L — ABNORMAL LOW (ref 3.5–5.1)
Sodium: 140 mmol/L (ref 135–145)

## 2021-07-14 LAB — CBC
HCT: 40.3 % (ref 39.0–52.0)
Hemoglobin: 13.2 g/dL (ref 13.0–17.0)
MCH: 28.4 pg (ref 26.0–34.0)
MCHC: 32.8 g/dL (ref 30.0–36.0)
MCV: 86.7 fL (ref 80.0–100.0)
Platelets: 140 10*3/uL — ABNORMAL LOW (ref 150–400)
RBC: 4.65 MIL/uL (ref 4.22–5.81)
RDW: 13.3 % (ref 11.5–15.5)
WBC: 7.9 10*3/uL (ref 4.0–10.5)
nRBC: 0 % (ref 0.0–0.2)

## 2021-07-14 LAB — MRSA NEXT GEN BY PCR, NASAL: MRSA by PCR Next Gen: NOT DETECTED

## 2021-07-14 LAB — HEPARIN LEVEL (UNFRACTIONATED): Heparin Unfractionated: 0.44 IU/mL (ref 0.30–0.70)

## 2021-07-14 LAB — MAGNESIUM: Magnesium: 2.2 mg/dL (ref 1.7–2.4)

## 2021-07-14 MED ORDER — POTASSIUM CHLORIDE CRYS ER 20 MEQ PO TBCR
40.0000 meq | EXTENDED_RELEASE_TABLET | Freq: Once | ORAL | Status: AC
  Administered 2021-07-14: 40 meq via ORAL
  Filled 2021-07-14: qty 2

## 2021-07-14 MED ORDER — PERFLUTREN LIPID MICROSPHERE
1.0000 mL | INTRAVENOUS | Status: DC | PRN
  Administered 2021-07-14: 2 mL via INTRAVENOUS
  Filled 2021-07-14: qty 10
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
## 2021-07-14 NOTE — Hospital Course (Addendum)
Per HPI: ?CHRISTPOHER SIEVERS is a 36 y.o. 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  He states that the shortness of breath is improved with rest and he also claims that he had left calf pain and tightness about a week ago prior to this all starting, but this has now improved.  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  He has no fevers, chills, cough, or hemoptysis. ? ?3/25: Patient was admitted for submassive PE and started on heparin drip.  He will need to remain on bedrest and be closely observed over the next 48 hours.  2D echocardiogram pending.  Noted to have right lower extremity DVT on ultrasound. ? ?3/26: Patient continues to do well and 2D echocardiogram demonstrates significant RV strain.  Vital signs are stable.  Asked pharmacy to increase heparin dosing to higher end of therapeutic range.  Anticipate discharge within the next 24 hours. ?

## 2021-07-14 NOTE — Progress Notes (Signed)
?PROGRESS NOTE ? ? ? ?Cristian Flores  [id number] DOB: 02-Jul-1985 DOA: 07/13/2021 ?PCP: Celene Squibb, MD ? ? ?Brief Narrative:  ?Per HPI: ?Cristian Flores is a 36 y.o. 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  He states that the shortness of breath is improved with rest and he also claims that he had left calf pain and tightness about a week ago prior to this all starting, but this has now improved.  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  He has no fevers, chills, cough, or hemoptysis. ? ?3/25: Patient was admitted for submassive PE and started on heparin drip.  He will need to remain on bedrest and be closely observed over the next 48 hours.  2D echocardiogram pending.  Noted to have right lower extremity DVT on ultrasound.  ? ? ?Assessment & Plan: ?  ?Principal Problem: ?  Acute pulmonary embolism ([place]) ?Active Problems: ?  Obesity, unspecified ? 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 ?Obesity, unspecified ?Lifestyle changes outpatient ? ? ?DVT prophylaxis: Heparin drip ?Code Status: Full ?Family Communication: None at bedside ?Disposition Plan:  ?Status is: Inpatient ?Remains inpatient appropriate because: Need for IV heparin drip ongoing ? ?Consultants:  ?PCCM ? ?Procedures:  ?See below ? ?Antimicrobials:  ?None ? ? ?Subjective: ?Patient seen and evaluated today with no new acute complaints or concerns. No acute concerns or events noted overnight. ? ?Objective: ?Vitals:  ?  07/14/21 0900 07/14/21 1000 07/14/21 1100 07/14/21 1121  ?BP: 106/67 115/66 (!) 97/54   ?Pulse: 94 82 80   ?Resp: 16 20 19    ?Temp:    98 ?F (36.7 ?C)  ?TempSrc:    Oral  ?SpO2: 96%     ?Weight:      ?Height:      ? ? ?Intake/Output Summary (Last 24 hours) at 07/14/2021 1216 ?Last data filed at 07/14/2021 [id number] ?Gross per 24 hour  ?Intake 448.31 ml  ?Output 1100 ml  ?Net -651.69 ml  ? ?Filed Weights  ? 07/13/21 0007  ?Weight: 122.5 kg  ? ? ?Examination: ? ?General exam: Appears calm and comfortable  ?Respiratory system: Clear to auscultation. Respiratory effort normal.  On nasal cannula oxygen. ?Cardiovascular system: S1 & S2 heard, RRR.  ?Gastrointestinal system: Abdomen is soft ?Central nervous system: Alert and awake ?Extremities: No edema ?Skin: No significant lesions noted ?Psychiatry: Flat affect. ? ? ? ?Data Reviewed: I have personally reviewed following labs and imaging studies ? ?CBC: ?Recent Labs  ?Lab 07/13/21 ?0213 07/14/21 ?0342  ?WBC 8.6 7.9  ?NEUTROABS 5.2  --   ?HGB 14.5 13.2  ?HCT 42.3 40.3  ?MCV 83.9 86.7  ?PLT 145* 140*  ? ?Basic Metabolic Panel: ?Recent Labs  ?Lab 07/13/21 ?0213 07/14/21 ?0342  ?NA 137 140  ?K 3.5 3.4*  ?CL 104 107  ?CO2 22 25  ?GLUCOSE 165* 152*  ?BUN 13 15  ?CREATININE 1.14 1.14  ?CALCIUM 9.0 8.5*  ?MG  --  2.2  ? ?GFR: ?Estimated Creatinine Clearance: 119.4  mL/min (by C-G formula based on SCr of 1.14 mg/dL). ?Liver Function Tests: ?Recent Labs  ?Lab 07/13/21 ?0213  ?AST 30  ?ALT 36  ?ALKPHOS 54  ?BILITOT 0.9  ?PROT 7.6  ?ALBUMIN 4.0  ? ?No results for input(s): LIPASE, AMYLASE in the last 168 hours. ?No results for input(s): AMMONIA in the last 168 hours. ?Coagulation Profile: ?No results for input(s): INR, PROTIME in the last 168 hours. ?Cardiac Enzymes: ?No results for input(s): CKTOTAL, CKMB, CKMBINDEX, TROPONINI in the last 168 hours. ?BNP (last 3 results) ?No results for input(s): PROBNP in the last 8760 hours. ?HbA1C: ?No results for input(s): HGBA1C in the last 72  hours. ?CBG: ?No results for input(s): GLUCAP in the last 168 hours. ?Lipid Profile: ?No results for input(s): CHOL, HDL, LDLCALC, TRIG, CHOLHDL, LDLDIRECT in the last 72 hours. ?Thyroid Function Tests: ?No results for input(s): TSH, T4TOTAL, FREET4, T3FREE, THYROIDAB in the last 72 hours. ?Anemia Panel: ?No results for input(s): VITAMINB12, FOLATE, FERRITIN, TIBC, IRON, RETICCTPCT in the last 72 hours. ?Sepsis Labs: ?No results for input(s): PROCALCITON, LATICACIDVEN in the last 168 hours. ? ?Recent Results (from the past 240 hour(s))  ?MRSA Next Gen by PCR, Nasal     Status: None  ? Collection Time: 07/13/21  8:28 PM  ? Specimen: Nasal Mucosa; Nasal Swab  ?Result Value Ref Range Status  ? MRSA by PCR Next Gen NOT DETECTED NOT DETECTED Final  ?  Comment: (NOTE) ?The GeneXpert MRSA Assay (FDA approved for NASAL specimens only), ?is one component of a comprehensive MRSA colonization surveillance ?program. It is not intended to diagnose MRSA infection nor to guide ?or monitor treatment for MRSA infections. ?Test performance is not FDA approved in patients less than 2 years ?old. ?Performed at [facility], [address], [place], [place] [address] ?  ?  ? ? ? ? ? ?Radiology Studies: ?CT Angio Chest PE W and/or Wo Contrast ? ?Result Date: 07/13/2021 ?CLINICAL DATA:  Pulmonary embolism suspected.  History of PE. EXAM: CT ANGIOGRAPHY CHEST WITH CONTRAST TECHNIQUE: Multidetector CT imaging of the chest was performed using the standard protocol during bolus administration of intravenous contrast. Multiplanar CT image reconstructions and MIPs were obtained to evaluate the vascular anatomy. RADIATION DOSE REDUCTION: This exam was performed according to the departmental dose-optimization program which includes automated exposure control, adjustment of the mA and/or kV according to patient size and/or use of iterative reconstruction technique. CONTRAST:  124mL OMNIPAQUE IOHEXOL 350 MG/ML SOLN COMPARISON:  09/08/2012 FINDINGS:  Cardiovascular: Large volume of acute branching pulmonary emboli seen affecting all lobes with diffuse segmental involvement and multiple occlusive acute appearing clots. Bilateral lobar clot is present. There is dilatation of the right heart with RV to LV ratio of 1.57. No pericardial effusion. Negative aorta. Critical Value/emergent results were called by telephone at the time of interpretation on 07/13/2021 at 4:55 am to provider [facility] , who verbally acknowledged these results. Mediastinum/Nodes: Negative for adenopathy or mass Lungs/Pleura: Dependent atelectatic type opacity. There is no no edema or infarct. Upper Abdomen: Negative Musculoskeletal: Negative Review of the MIP images confirms the above findings. IMPRESSION: Positive for acute pulmonary embolism with CT evidence of right heart strain (RV/LV Ratio = 1.57) consistent with at least submassive (intermediate risk) PE. The presence of right heart strain has been associated with an increased risk of morbidity and mortality. Please refer to the "PE Focused" order set in EPIC. Electronically Signed   By: Jorje Guild M.D.   On: 07/13/2021 04:54  ? ?  US Venous Img Lower Bilateral (DVT) ? ?Result Date: 07/13/2021 ?CLINICAL DATA:  Shortness of breath, CT pulmonary angiogram demonstrating pulmonary embolism EXAM: BILATERAL LOWER EXTREMITY VENOUS DOPPLER ULTRASOUND TECHNIQUE: Gray-scale sonography with graded compression, as well as color Doppler and duplex ultrasound were performed to evaluate the lower extremity deep venous systems from the level of the common femoral vein and including the common femoral, femoral, profunda femoral, popliteal and calf veins including the posterior tibial, peroneal and gastrocnemius veins when visible. The superficial great saphenous vein was also interrogated. Spectral Doppler was utilized to evaluate flow at rest and with distal augmentation maneuvers in the common femoral, femoral and popliteal veins. COMPARISON:   None. FINDINGS: RIGHT LOWER EXTREMITY Common Femoral Vein: No evidence of thrombus. Normal compressibility, respiratory phasicity and response to augmentation. Saphenofemoral Junction: No evidence of thrombus. Normal c
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
## 2021-07-15 LAB — BASIC METABOLIC PANEL
Anion gap: 6 (ref 5–15)
BUN: 12 mg/dL (ref 6–20)
CO2: 24 mmol/L (ref 22–32)
Calcium: 8.6 mg/dL — ABNORMAL LOW (ref 8.9–10.3)
Chloride: 109 mmol/L (ref 98–111)
Creatinine, Ser: 1.01 mg/dL (ref 0.61–1.24)
GFR, Estimated: >60 mL/min (ref >60)
Glucose, Bld: 126 mg/dL — ABNORMAL HIGH (ref 70–99)
Potassium: 3.6 mmol/L (ref 3.5–5.1)
Sodium: 139 mmol/L (ref 135–145)

## 2021-07-15 LAB — CBC
HCT: 40.1 % (ref 39.0–52.0)
Hemoglobin: 13.2 g/dL (ref 13.0–17.0)
MCH: 28.5 pg (ref 26.0–34.0)
MCHC: 32.9 g/dL (ref 30.0–36.0)
MCV: 86.6 fL (ref 80.0–100.0)
Platelets: 156 10*3/uL (ref 150–400)
RBC: 4.63 MIL/uL (ref 4.22–5.81)
RDW: 13.3 % (ref 11.5–15.5)
WBC: 7.6 10*3/uL (ref 4.0–10.5)
nRBC: 0 % (ref 0.0–0.2)

## 2021-07-15 LAB — HEPARIN LEVEL (UNFRACTIONATED): Heparin Unfractionated: 0.46 IU/mL (ref 0.30–0.70)
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
## 2021-07-15 NOTE — Progress Notes (Signed)
?PROGRESS NOTE ? ? ? ?Cristian Flores  [id number] DOB: 04-11-1986 DOA: 07/13/2021 ?PCP: Celene Squibb, MD ? ? ?Brief Narrative:  ?Per HPI: ?Cristian Flores is a 36 y.o. 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  He states that the shortness of breath is improved with rest and he also claims that he had left calf pain and tightness about a week ago prior to this all starting, but this has now improved.  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  He has no fevers, chills, cough, or hemoptysis. ? ?3/25: Patient was admitted for submassive PE and started on heparin drip.  He will need to remain on bedrest and be closely observed over the next 48 hours.  2D echocardiogram pending.  Noted to have right lower extremity DVT on ultrasound. ? ?3/26: Patient continues to do well and 2D echocardiogram demonstrates significant RV strain.  Vital signs are stable.  Asked pharmacy to increase heparin dosing to higher end of therapeutic range.  Anticipate discharge within the next 24 hours.  ? ? ?Assessment & Plan: ?  ?Principal Problem: ?  Acute pulmonary embolism ([facility] [facility]) ?Active Problems: ?  Obesity, unspecified ? 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 ?Obesity, unspecified ?Lifestyle changes outpatient ? ? ?DVT prophylaxis: Heparin drip ?Code Status: Full ?Family Communication: None at bedside ?Disposition Plan:  ?Status is:  Inpatient ?Remains inpatient appropriate because: Need for IV heparin drip ongoing ?  ?Consultants:  ?PCCM ?  ?Procedures:  ?See below ?  ?Antimicrobials:  ?None ?  ?Subjective: ?Patient seen and evaluated today with no new acute complaints or concerns. No acute concerns or events noted overnight.  He is eager to start moving around. ? ?Objective: ?Vitals:  ? 07/15/21 0500 07/15/21 0704 07/15/21 0800 07/15/21 1000  ?BP:   (!) 150/77   ?Pulse:  77 (!) 103 84  ?Resp:  16 (!) 21 16  ?Temp:  97.6 ?F (36.4 ?C)    ?TempSrc:  Oral    ?SpO2:  90% 97% 94%  ?Weight: 126.7 kg     ?Height:      ? ? ?Intake/Output Summary (Last 24 hours) at 07/15/2021 1025 ?Last data filed at 07/15/2021 0957 ?Gross per 24 hour  ?Intake 437.33 ml  ?Output 2480 ml  ?Net -2042.67 ml  ? ?Filed Weights  ? 07/13/21 0007 07/15/21 0500  ?Weight: 122.5 kg 126.7 kg  ? ? ?Examination: ? ?General exam: Appears calm and comfortable  ?Respiratory system: Clear to auscultation. Respiratory effort normal. ?Cardiovascular system: S1 & S2 heard, RRR.  ?Gastrointestinal system: Abdomen is soft ?Central nervous system: Alert and awake ?Extremities: No edema ?Skin: No significant lesions noted ?Psychiatry: Flat affect. ? ? ? ?Data Reviewed: I have personally reviewed following labs and imaging studies ? ?CBC: ?Recent Labs  ?Lab 07/13/21 ?0213 07/14/21 ?0342 07/15/21 ?0321  ?WBC 8.6 7.9 7.6  ?NEUTROABS 5.2  --   --   ?HGB 14.5 13.2 13.2  ?HCT  42.3 40.3 40.1  ?MCV 83.9 86.7 86.6  ?PLT 145* 140* 156  ? ?Basic Metabolic Panel: ?Recent Labs  ?Lab 07/13/21 ?0213 07/14/21 ?0342 07/15/21 ?0321  ?NA 137 140 139  ?K 3.5 3.4* 3.6  ?CL 104 107 109  ?CO2 22 25 24   ?GLUCOSE 165* 152* 126*  ?BUN 13 15 12   ?CREATININE 1.14 1.14 1.01  ?CALCIUM 9.0 8.5* 8.6*  ?MG  --  2.2  --   ? ?GFR: ?Estimated Creatinine Clearance: 137.2 mL/min (by C-G formula based on SCr of 1.01 mg/dL). ?Liver Function Tests: ?Recent Labs  ?Lab 07/13/21 ?0213  ?AST 30  ?ALT 36  ?ALKPHOS 54  ?BILITOT 0.9  ?PROT 7.6   ?ALBUMIN 4.0  ? ?No results for input(s): LIPASE, AMYLASE in the last 168 hours. ?No results for input(s): AMMONIA in the last 168 hours. ?Coagulation Profile: ?No results for input(s): INR, PROTIME in the last 168 hours. ?Cardiac Enzymes: ?No results for input(s): CKTOTAL, CKMB, CKMBINDEX, TROPONINI in the last 168 hours. ?BNP (last 3 results) ?No results for input(s): PROBNP in the last 8760 hours. ?HbA1C: ?No results for input(s): HGBA1C in the last 72 hours. ?CBG: ?No results for input(s): GLUCAP in the last 168 hours. ?Lipid Profile: ?No results for input(s): CHOL, HDL, LDLCALC, TRIG, CHOLHDL, LDLDIRECT in the last 72 hours. ?Thyroid Function Tests: ?No results for input(s): TSH, T4TOTAL, FREET4, T3FREE, THYROIDAB in the last 72 hours. ?Anemia Panel: ?No results for input(s): VITAMINB12, FOLATE, FERRITIN, TIBC, IRON, RETICCTPCT in the last 72 hours. ?Sepsis Labs: ?No results for input(s): PROCALCITON, LATICACIDVEN in the last 168 hours. ? ?Recent Results (from the past 240 hour(s))  ?MRSA Next Gen by PCR, Nasal     Status: None  ? Collection Time: 07/13/21  8:28 PM  ? Specimen: Nasal Mucosa; Nasal Swab  ?Result Value Ref Range Status  ? MRSA by PCR Next Gen NOT DETECTED NOT DETECTED Final  ?  Comment: (NOTE) ?The GeneXpert MRSA Assay (FDA approved for NASAL specimens only), ?is one component of a comprehensive MRSA colonization surveillance ?program. It is not intended to diagnose MRSA infection nor to guide ?or monitor treatment for MRSA infections. ?Test performance is not FDA approved in patients less than 2 years ?old. ?Performed at [facility], [address], [place], [place] [address] ?  ?  ? ? ? ? ? ?Radiology Studies: ?US Venous Img Lower Bilateral (DVT) ? ?Result Date: 07/13/2021 ?CLINICAL DATA:  Shortness of breath, CT pulmonary angiogram demonstrating pulmonary embolism EXAM: BILATERAL LOWER EXTREMITY VENOUS DOPPLER ULTRASOUND TECHNIQUE: Gray-scale sonography with graded compression, as well as  color Doppler and duplex ultrasound were performed to evaluate the lower extremity deep venous systems from the level of the common femoral vein and including the common femoral, femoral, profunda femoral, popliteal and calf veins including the posterior tibial, peroneal and gastrocnemius veins when visible. The superficial great saphenous vein was also interrogated. Spectral Doppler was utilized to evaluate flow at rest and with distal augmentation maneuvers in the common femoral, femoral and popliteal veins. COMPARISON:  None. FINDINGS: RIGHT LOWER EXTREMITY Common Femoral Vein: No evidence of thrombus. Normal compressibility, respiratory phasicity and response to augmentation. Saphenofemoral Junction: No evidence of thrombus. Normal compressibility and flow on color Doppler imaging. Profunda Femoral Vein: No evidence of thrombus. Normal compressibility and flow on color Doppler imaging. Femoral Vein: No evidence of thrombus. Normal compressibility, respiratory phasicity and response to augmentation. Popliteal Vein: Noncompressible with hypoechoic thrombus. Calf Veins: Select calf veins demonstrate compressibility with hypoechoic thrombus. LEFT  LOWER EXTREMITY Common Femoral Vein: No evidence of thrombus. Normal compressibility, respiratory phasicity and response to augmentation. Saphenofemoral Junction: No evidence of thrombus. Normal compressibility and flow on color Doppler imaging. Profunda Femoral Vein: No evidence of thrombus. Normal compressibility and flow on color Doppler imaging. Femoral Vein: No evidence of thrombus. Normal compressibility, respiratory phasicity and response to augmentation. Popliteal Vein: No evidence of thrombus. Normal compressibility, respiratory phasicity and response to augmentation. Calf Veins: No evidence of thrombus. Normal compressibility and flow on color Doppler imaging. Other Findings:  None. IMPRESSION: 1. There is acute appearing DVT involving the right popliteal vein  extending into the calf veins. 2. No findings of DVT in the left lower extremity. Electronically Signed   By: Albin Felling M.D.   On: 07/13/2021 11:14  ? ?ECHOCARDIOGRAM COMPLETE ? ?Result Date: 07/14/2021 ?   [facility]

## 2021-07-16 LAB — CBC
HCT: 40.5 % (ref 39.0–52.0)
Hemoglobin: 14 g/dL (ref 13.0–17.0)
MCH: 30 pg (ref 26.0–34.0)
MCHC: 34.6 g/dL (ref 30.0–36.0)
MCV: 86.7 fL (ref 80.0–100.0)
Platelets: 157 10*3/uL (ref 150–400)
RBC: 4.67 MIL/uL (ref 4.22–5.81)
RDW: 13.5 % (ref 11.5–15.5)
WBC: 7 10*3/uL (ref 4.0–10.5)
nRBC: 0 % (ref 0.0–0.2)

## 2021-07-16 LAB — HEPARIN LEVEL (UNFRACTIONATED): Heparin Unfractionated: 0.43 IU/mL (ref 0.30–0.70)

## 2021-07-16 MED ORDER — APIXABAN 5 MG PO TABS
10.0000 mg | ORAL_TABLET | Freq: Two times a day (BID) | ORAL | Status: DC
  Administered 2021-07-16: 10 mg via ORAL
  Filled 2021-07-16: qty 2

## 2021-07-16 MED ORDER — ALBUTEROL SULFATE HFA 108 (90 BASE) MCG/ACT IN AERS
2.0000 | INHALATION_SPRAY | RESPIRATORY_TRACT | 1 refills | Status: AC | PRN
Start: 2021-07-16 — End: ?

## 2021-07-16 MED ORDER — APIXABAN 5 MG PO TABS
5.0000 mg | ORAL_TABLET | Freq: Two times a day (BID) | ORAL | Status: DC
Start: 2021-07-23 — End: 2021-07-16

## 2021-07-16 MED ORDER — APIXABAN 5 MG PO TABS: 10.0000 mg | ORAL_TABLET | Freq: Two times a day (BID) | ORAL | 0 refills | Status: AC

## 2021-07-16 MED ORDER — APIXABAN 5 MG PO TABS: 5.0000 mg | ORAL_TABLET | Freq: Two times a day (BID) | ORAL | 3 refills | Status: AC

## 2021-07-16 NOTE — Progress Notes (Signed)
ANTICOAGULATION CONSULT NOTE - Follow Up Consult ? ?Pharmacy Consult for Acute PE, recurrent ?Indication: pulmonary embolus and DVT ? ?Allergies  ?Allergen Reactions  ? Contrast Media [Iodinated Contrast Media] Nausea And Vomiting  ?  Patient begins projectile vomiting as soon as contrast begins.   ? ? ?Patient Measurements: ?Height: 5\' 11"  (180.3 cm) ?Weight: 126.4 kg (278 lb 10.6 oz) ?IBW/kg (Calculated) : 75.3 ? ?Labs: ?Recent Labs  ?  07/14/21 ?0342 07/15/21 ?0321 07/16/21 ?0413 07/16/21 ?0414  ?HGB 13.2 13.2 14.0  --   ?HCT 40.3 40.1 40.5  --   ?PLT 140* 156 157  --   ?HEPARINUNFRC 0.44 0.46  --  0.43  ?CREATININE 1.14 1.01  --   --   ? ? ?Estimated Creatinine Clearance: 136.9 mL/min (by C-G formula based on SCr of 1.01 mg/dL). ? ? ?Assessment: ?36 year old obese male hx PE previously on xarelto (stopped) admitted with recurrent PE/DVT. Heparin is therapeutic, targeting upper range on normal. ? ?Goal of Therapy:  ?Heparin level 0.3-0.7 units/ml ?Monitor platelets by anticoagulation protocol: Yes ?  ?Plan:  ?Increase heparin drip to 2100 units/hr ?Continue daily HL and CBC ?F/u DOAC start ? ?31 Chika ?07/16/2021,8:16 AM ? ? ?

## 2021-07-16 NOTE — Progress Notes (Signed)
SATURATION QUALIFICATIONS: (This note is used to comply with regulatory documentation for home oxygen) ? ?Patient Saturations on Room Air at Rest = 95% ? ?Patient Saturations on Room Air while Ambulating = 92% ? ? ?Please briefly explain why patient needs home oxygen: N/A ? ?Patient ambulated 2 laps around nurses station. Oxygen level stayed above 90 entire time. PT stated he did feel slightly short of breath, but significantly improved since admission. Pt did not require to stop or rest while ambulating. No chest pain or lightheadedness. Dr. Manuella Ghazi notified. ?

## 2021-07-16 NOTE — Discharge Summary (Signed)
Physician Discharge Summary  ?Cristian CottaDuane E Aguino ZOX:[id number] DOB: 05/26/1985 DOA: 07/13/2021 ? ?PCP: Benita StabileHall, John Z, MD ? ?Admit date: 07/13/2021 ? ?Discharge date: 07/16/2021 ? ?Admitted From:Home ? ?Disposition:  Home ? ?Recommendations for Outpatient Follow-up:  ?Follow up with PCP in 1-2 weeks ?Remain on Eliquis as prescribed lifelong ? ?Home Health: None ? ?Equipment/Devices: None ? ?Discharge Condition:Stable ? ?CODE STATUS: Full ? ?Diet recommendation: Heart Healthy ? ?Brief/Interim Summary: ?Per HPI: ?Cristian Flores is a 36 y.o. 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  He states that the shortness of breath is improved with rest and he also claims that he had left calf pain and tightness about a week ago prior to this all starting, but this has now improved.  He currently works as a Naval architecttruck driver and states that he has not been on anticoagulation.  He has no fevers, chills, cough, or hemoptysis. ?  ?3/25: Patient was admitted for submassive PE and started on heparin drip.  He will need to remain on bedrest and be closely observed over the next 48 hours.  2D echocardiogram pending.  Noted to have right lower extremity DVT on ultrasound. ?  ?3/26: Patient continues to do well and 2D echocardiogram demonstrates significant RV strain.  Vital signs are stable.  Asked pharmacy to increase heparin dosing to higher end of therapeutic range.  Anticipate discharge within the next 24 hours.  ? ?3/27: Patient is no longer requiring any oxygen supplementation and continues to have stable vital signs.  Case discussed with pulmonology Dr. Craige CottaSood who agrees that patient is in stable condition for discharge today on Eliquis.  He will need to remain on this medication lifelong.  No other acute events noted throughout the course of the stay. ? ?Discharge Diagnoses:  ?Principal Problem: ?  Acute  pulmonary embolism ([facility]) ?Active Problems: ?  Obesity, unspecified ? ?Principal discharge diagnosis: Acute submassive PE with right lower extremity DVT. ? ?Discharge Instructions ? ?Discharge Instructions   ? ? Diet - low sodium heart healthy   Complete by: As directed ?  ? Increase activity slowly   Complete by: As directed ?  ? ?  ? ?Allergies as of 07/16/2021   ? ?   Reactions  ? Contrast Media [iodinated Contrast Media] Nausea And Vomiting  ? Patient begins projectile vomiting as soon as contrast begins.   ? ?  ? ?  ?Medication List  ?  ? ?STOP taking these medications   ? ?clindamycin 150 MG capsule ?Commonly known as: CLEOCIN ?  ?diclofenac 75 MG EC tablet ?Commonly known as: VOLTAREN ?  ? ?  ? ?TAKE these medications   ? ?albuterol 108 (90 Base) MCG/ACT inhaler ?Commonly known as: VENTOLIN HFA ?Inhale 2 puffs into the lungs every 2 (two) hours as needed for wheezing or shortness of breath. ?  ?apixaban 5 MG Tabs tablet ?Commonly known as: ELIQUIS ?Take 2 tablets (10 mg total) by mouth 2 (two) times daily for 7 days. ?  ?apixaban 5 MG Tabs tablet ?Commonly known as: ELIQUIS ?Take 1 tablet (5 mg total) by mouth 2 (two) times daily. ?Start taking on: July 23, 2021 ?  ? ?  ? ? Follow-up Information   ? ? Benita StabileHall, John Z, MD. Schedule an appointment as soon as possible for a visit in 1 week(s).   ?Specialty: Internal Medicine ?Contact information: ?[address] ?  [address] ?[place] [address] ?[phone] ? ? ?  ?  ? ?  ?  ? ?  ? ?Allergies  ?Allergen Reactions  ? Contrast Media [Iodinated Contrast Media] Nausea And Vomiting  ?  Patient begins projectile vomiting as soon as contrast begins.   ? ? ?Consultations: ?None ? ? ?Procedures/Studies: ?CT Angio Chest PE W and/or Wo Contrast ? ?Result Date: 07/13/2021 ?CLINICAL DATA:  Pulmonary embolism suspected.  History of PE. EXAM: CT ANGIOGRAPHY CHEST WITH CONTRAST TECHNIQUE: Multidetector CT imaging of the chest was performed using the standard protocol during bolus  administration of intravenous contrast. Multiplanar CT image reconstructions and MIPs were obtained to evaluate the vascular anatomy. RADIATION DOSE REDUCTION: This exam was performed according to the departmental dose-optimization program which includes automated exposure control, adjustment of the mA and/or kV according to patient size and/or use of iterative reconstruction technique. CONTRAST:  OMNIPAQUE IOHEXOL 350 MG/ML SOLN COMPARISON:  09/08/2012 FINDINGS: Cardiovascular: Large volume of acute branching pulmonary emboli seen affecting all lobes with diffuse segmental involvement and multiple occlusive acute appearing clots. Bilateral lobar clot is present. There is dilatation of the right heart with RV to LV ratio of 1.57. No pericardial effusion. Negative aorta. Critical Value/emergent results were called by telephone at the time of interpretation on 07/13/2021 at 4:55 am to provider [facility] , who verbally acknowledged these results. Mediastinum/Nodes: Negative for adenopathy or mass Lungs/Pleura: Dependent atelectatic type opacity. There is no no edema or infarct. Upper Abdomen: Negative Musculoskeletal: Negative Review of the MIP images confirms the above findings. IMPRESSION: Positive for acute pulmonary embolism with CT evidence of right heart strain (RV/LV Ratio = 1.57) consistent with at least submassive (intermediate risk) PE. The presence of right heart strain has been associated with an increased risk of morbidity and mortality. Please refer to the "PE Focused" order set in EPIC. Electronically Signed   By: Tiburcio Pea M.D.   On: 07/13/2021 04:54  ? ?US Venous Img Lower Bilateral (DVT) ? ?Result Date: 07/13/2021 ?CLINICAL DATA:  Shortness of breath, CT pulmonary angiogram demonstrating pulmonary embolism EXAM: BILATERAL LOWER EXTREMITY VENOUS DOPPLER ULTRASOUND TECHNIQUE: Gray-scale sonography with graded compression, as well as color Doppler and duplex ultrasound were performed to  evaluate the lower extremity deep venous systems from the level of the common femoral vein and including the common femoral, femoral, profunda femoral, popliteal and calf veins including the posterior tibial, peroneal and gastrocnemius veins when visible. The superficial great saphenous vein was also interrogated. Spectral Doppler was utilized to evaluate flow at rest and with distal augmentation maneuvers in the common femoral, femoral and popliteal veins. COMPARISON:  None. FINDINGS: RIGHT LOWER EXTREMITY Common Femoral Vein: No evidence of thrombus. Normal compressibility, respiratory phasicity and response to augmentation. Saphenofemoral Junction: No evidence of thrombus. Normal compressibility and flow on color Doppler imaging. Profunda Femoral Vein: No evidence of thrombus. Normal compressibility and flow on color Doppler imaging. Femoral Vein: No evidence of thrombus. Normal compressibility, respiratory phasicity and response to augmentation. Popliteal Vein: Noncompressible with hypoechoic thrombus. Calf Veins: Select calf veins demonstrate compressibility with hypoechoic thrombus. LEFT LOWER EXTREMITY Common Femoral Vein: No evidence of thrombus. Normal compressibility, respiratory phasicity and response to augmentation. Saphenofemoral Junction: No evidence of thrombus. Normal compressibility and flow on color Doppler imaging. Profunda Femoral Vein: No evidence of thrombus. Normal compressibility and flow on color Doppler imaging. Femoral Vein: No evidence of thrombus. Normal compressibility, respiratory phasicity and response to augmentation. Popliteal Vein: No evidence of thrombus. Normal compressibility,  respiratory phasicity and response to augmentation. Calf Veins: No evidence of thrombus. Normal compressibility and flow on color Doppler imaging. Other Findings:  None. IMPRESSION: 1. There is acute appearing DVT involving the right popliteal vein extending into the calf veins. 2. No findings of DVT in  the left lower extremity. Electronically Signed   By: Olive Bass M.D.   On: 07/13/2021 11:14  ? ?DG Chest Portable 1 View ? ?Result Date: 07/13/2021 ?CLINICAL DATA:  Dyspnea, chest pain EXAM: PORTABLE CHEST 1 VIEW

## 2021-07-16 NOTE — TOC Progression Note (Addendum)
?  Transition of Care (TOC) Screening Note ? ? ?Patient Details  ?Name: DAEGON DEISS ?Date of Birth: Apr 21, 1986 ? ? ?Transition of Care (TOC) CM/SW Contact:    ?Elliot Gault, LCSW ?Phone Number: ?07/16/2021, 10:56 AM ? ? ? ?PCP list and information on [facility] given to pt by CMA at MD request. [facility] spoke with Financial Counselor, Jerene Dilling, to request follow up. Jerene Dilling states pt will have insurance effective 07/21/21. She will speak with pt about possible financial assistance for this stay. ? ?Transition of Care Department [facility]) has reviewed patient and no TOC needs have been identified at this time. We will continue to monitor patient advancement through interdisciplinary progression rounds. If new patient transition needs arise, please place a TOC consult. ? ? ?

## 2021-07-16 NOTE — Progress Notes (Signed)
-  Home instructions well-explained; All questions addressed and removed IV catheter; transported to the ED parking with assistance.  ?

## 2022-06-14 IMAGING — US US EXTREM LOW VENOUS
1 series · 13 of 24 positions shown · non-contrast
Comparison: None.

CLINICAL DATA: Shortness of breath, CT pulmonary angiogram
demonstrating pulmonary embolism



[Series 1: us extrem low venous · 0.08mm/px · 13 of 69 slices shown]
[im 1/69]
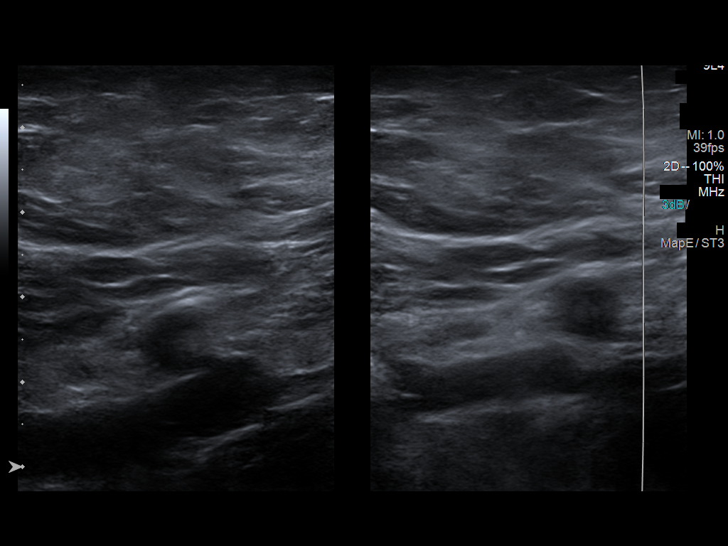
[im 6/69]
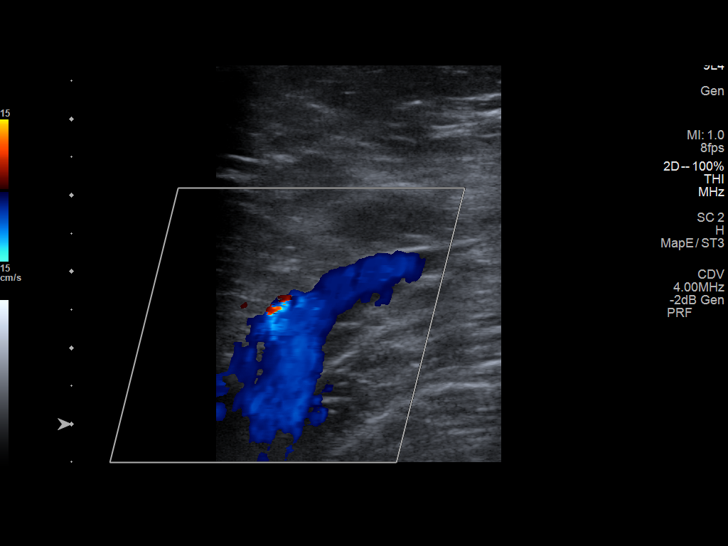
[im 12/69]
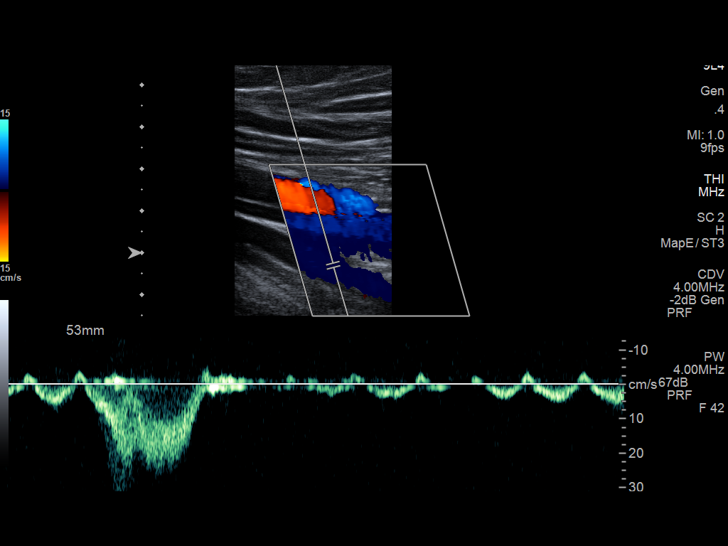
[im 18/69]
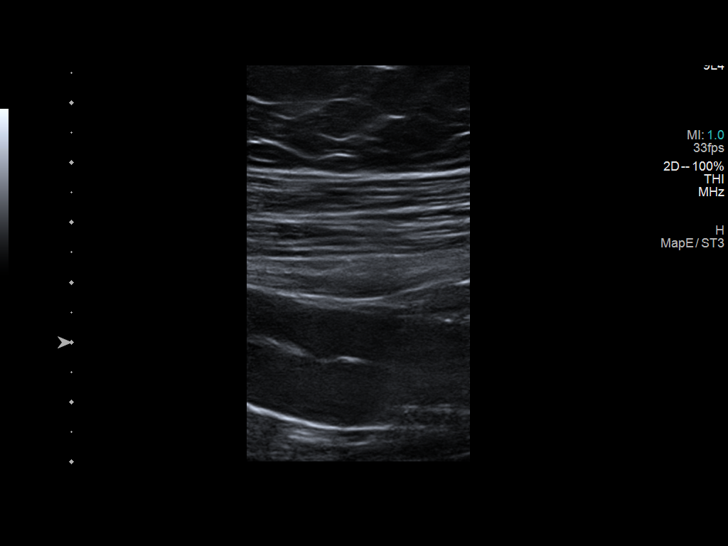
[im 24/69]
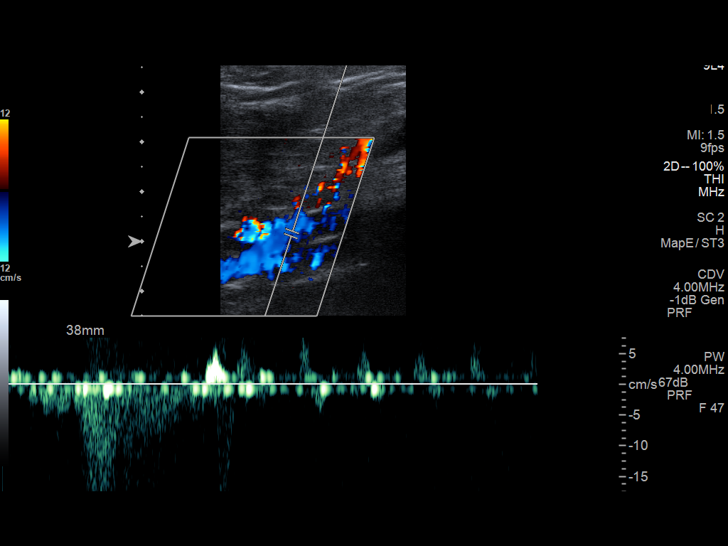
[im 30/69]
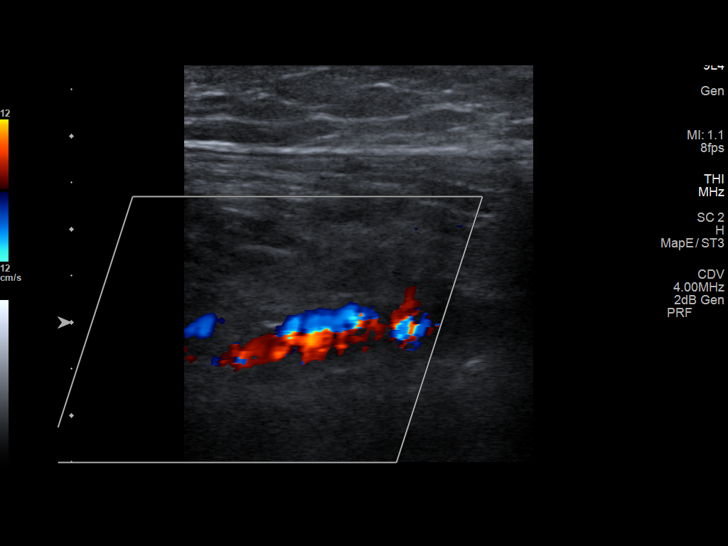
[im 36/69]
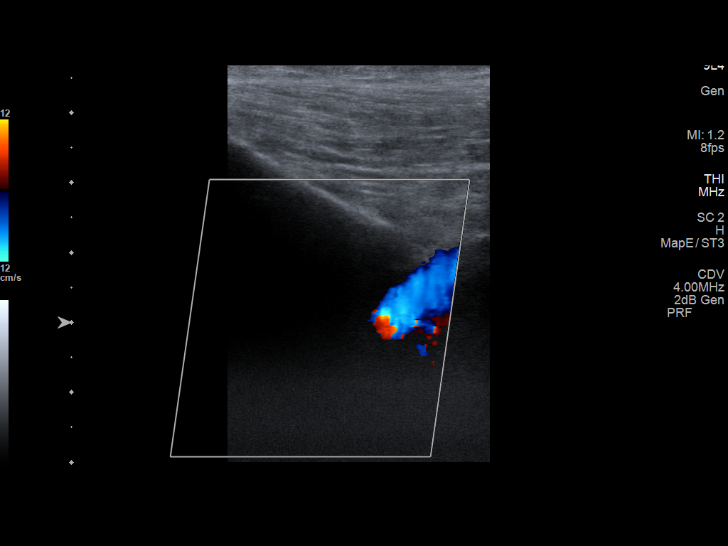
[im 39/69]
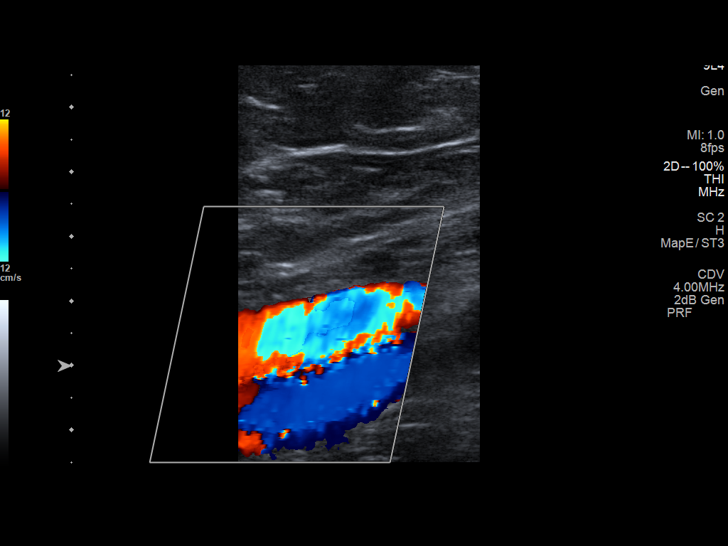
[im 45/69]
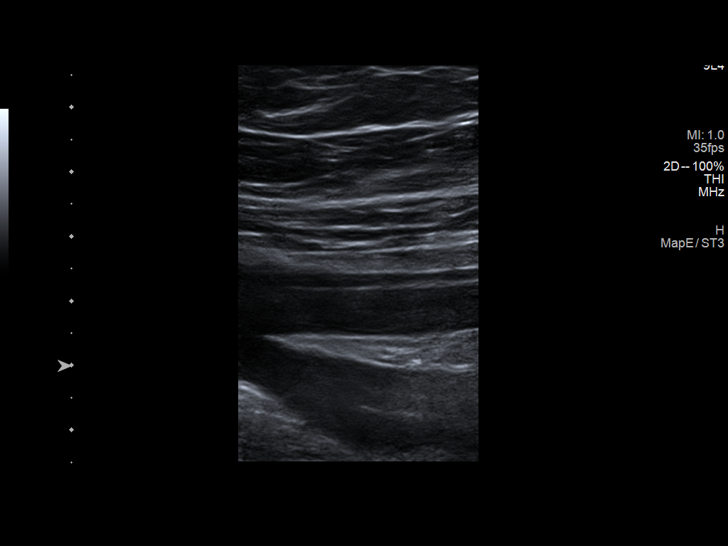
[im 51/69]
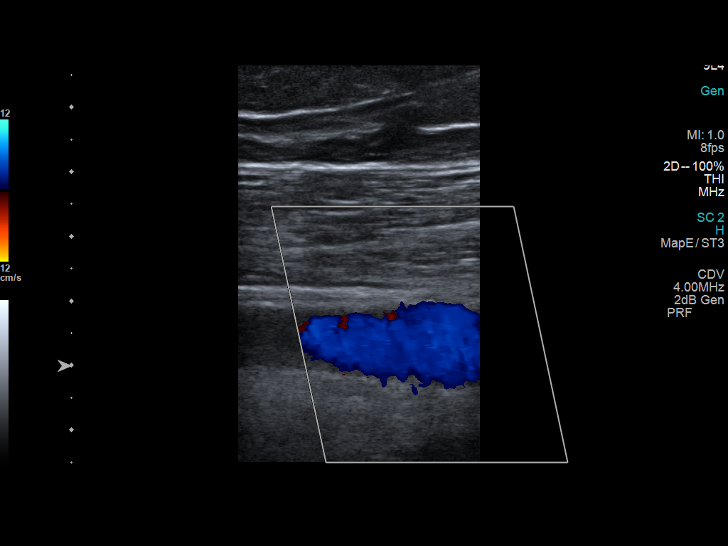
[im 57/69]
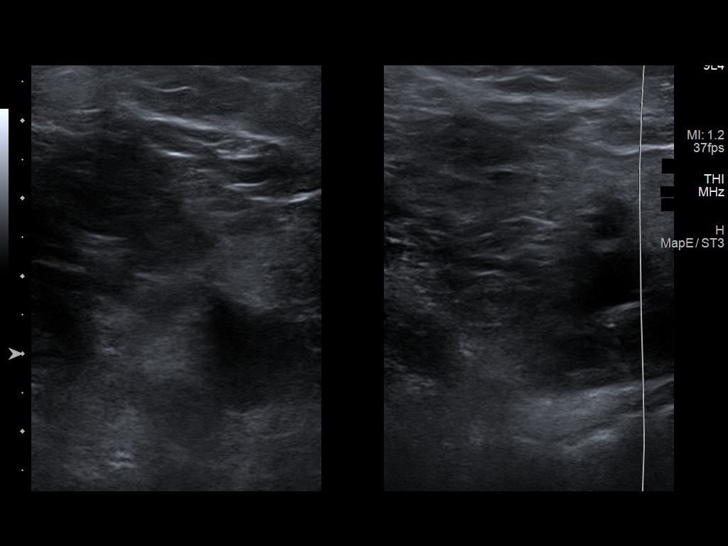
[im 63/69]
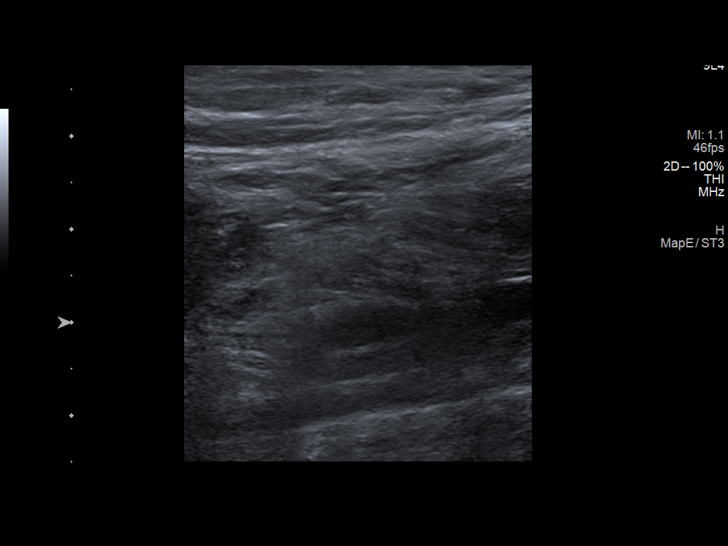
[im 69/69]
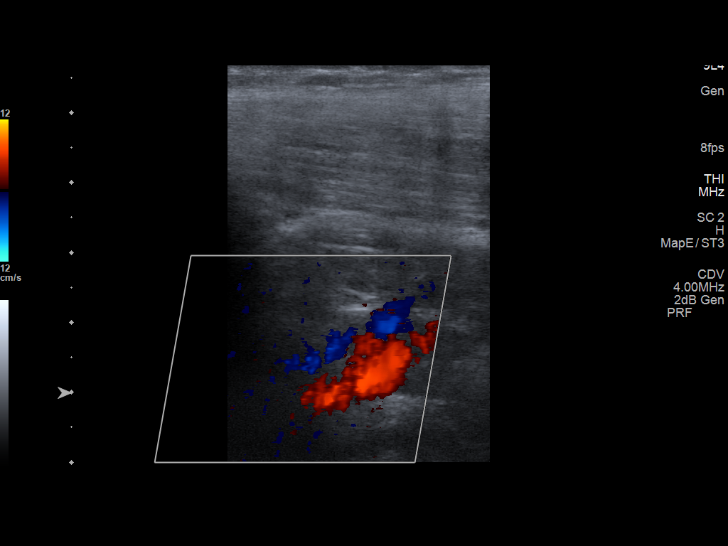

[13 of 24 positions shown; findings below may reference images not displayed]

FINDINGS: RIGHT LOWER EXTREMITY

Common Femoral Vein: No evidence of thrombus. Normal
compressibility, respiratory phasicity and response to augmentation.

Saphenofemoral Junction: No evidence of thrombus. Normal
compressibility and flow on color Doppler imaging.

Profunda Femoral Vein: No evidence of thrombus. Normal
compressibility and flow on color Doppler imaging.

Femoral Vein: No evidence of thrombus. Normal compressibility,
respiratory phasicity and response to augmentation.

Popliteal Vein: Noncompressible with hypoechoic thrombus.

Calf Veins: Select calf veins demonstrate compressibility with
hypoechoic thrombus.

LEFT LOWER EXTREMITY

Common Femoral Vein: No evidence of thrombus. Normal
compressibility, respiratory phasicity and response to augmentation.

Saphenofemoral Junction: No evidence of thrombus. Normal
compressibility and flow on color Doppler imaging.

Profunda Femoral Vein: No evidence of thrombus. Normal
compressibility and flow on color Doppler imaging.

Femoral Vein: No evidence of thrombus. Normal compressibility,
respiratory phasicity and response to augmentation.

Popliteal Vein: No evidence of thrombus. Normal compressibility,
respiratory phasicity and response to augmentation.

Calf Veins: No evidence of thrombus. Normal compressibility and flow
on color Doppler imaging.

Other Findings:  None.
IMPRESSION: 1. There is acute appearing DVT involving the right popliteal vein
extending into the calf veins.
2. No findings of DVT in the left lower extremity.
